# Patient Record
Sex: Male | Born: 1990 | Race: White | Hispanic: No | Marital: Single | Smoking: Current some day smoker
Health system: Southern US, Community
[De-identification: ages and names within clinical notes are randomized; demographics above are authoritative.]

## PROBLEM LIST (undated history)

## (undated) DIAGNOSIS — K219 Gastro-esophageal reflux disease without esophagitis: Secondary | ICD-10-CM

## (undated) DIAGNOSIS — L708 Other acne: Secondary | ICD-10-CM

## (undated) DIAGNOSIS — F988 Other specified behavioral and emotional disorders with onset usually occurring in childhood and adolescence: Secondary | ICD-10-CM

## (undated) HISTORY — DX: Other acne: L70.8

## (undated) HISTORY — PX: WISDOM TOOTH EXTRACTION: SHX21

## (undated) HISTORY — DX: Other specified behavioral and emotional disorders with onset usually occurring in childhood and adolescence: F98.8

## (undated) HISTORY — DX: Gastro-esophageal reflux disease without esophagitis: K21.9

---
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
## 2009-02-17 ENCOUNTER — Ambulatory Visit: Payer: Self-pay | Admitting: Family Medicine

## 2009-02-17 DIAGNOSIS — F988 Other specified behavioral and emotional disorders with onset usually occurring in childhood and adolescence: Secondary | ICD-10-CM | POA: Insufficient documentation

## 2009-02-17 DIAGNOSIS — L708 Other acne: Secondary | ICD-10-CM | POA: Insufficient documentation

## 2009-02-17 HISTORY — DX: Other acne: L70.8

## 2009-02-17 HISTORY — DX: Other specified behavioral and emotional disorders with onset usually occurring in childhood and adolescence: F98.8

## 2009-02-18 ENCOUNTER — Telehealth: Payer: Self-pay | Admitting: Family Medicine

## 2009-03-23 ENCOUNTER — Telehealth: Payer: Self-pay | Admitting: Family Medicine

## 2009-06-20 ENCOUNTER — Telehealth: Payer: Self-pay | Admitting: Family Medicine

## 2009-06-22 ENCOUNTER — Ambulatory Visit: Payer: Self-pay | Admitting: Family Medicine

## 2009-06-22 DIAGNOSIS — K219 Gastro-esophageal reflux disease without esophagitis: Secondary | ICD-10-CM

## 2009-06-22 HISTORY — DX: Gastro-esophageal reflux disease without esophagitis: K21.9

## 2009-07-04 ENCOUNTER — Telehealth: Payer: Self-pay | Admitting: Family Medicine

## 2009-08-16 ENCOUNTER — Telehealth: Payer: Self-pay | Admitting: Family Medicine

## 2011-07-24 LAB — BASIC METABOLIC PANEL
CO2: 28
Calcium: 9.7
Creatinine, Ser: 0.89
GFR calc Af Amer: >60
Sodium: 140

## 2011-08-27 ENCOUNTER — Encounter: Payer: Self-pay | Admitting: Family Medicine

## 2011-08-29 ENCOUNTER — Encounter: Payer: Self-pay | Admitting: Family Medicine

## 2011-08-29 ENCOUNTER — Ambulatory Visit (INDEPENDENT_AMBULATORY_CARE_PROVIDER_SITE_OTHER): Payer: PRIVATE HEALTH INSURANCE | Admitting: Family Medicine

## 2011-08-29 VITALS — BP 110/80 | Temp 98.2°F | Wt 178.0 lb

## 2011-08-29 DIAGNOSIS — N62 Hypertrophy of breast: Secondary | ICD-10-CM

## 2011-08-29 NOTE — Progress Notes (Signed)
  Subjective:    Patient ID: Maurice Roth, male    DOB: 30-Jan-1991, 20 y.o.   MRN: [id number]  HPI  Patient seen with asymmetric gynecomastia left breast greater than right. Noted for several months if not longer. Occasional minimal pain. Denies any nipple discharge. No headaches. No symptoms of hypogonadism. Prior history of marijuana use but not recently. Possible previous use of anabolic steroid. Currently not taking any supplements or prescription medications.   Review of Systems  Constitutional: Negative for appetite change, fatigue and unexpected weight change.  Cardiovascular: Negative for chest pain.  Neurological: Negative for dizziness and headaches.  Hematological: Negative for adenopathy. Does not bruise/bleed easily.       Objective:   Physical Exam  Constitutional: He appears well-developed and well-nourished.  Neck: Neck supple.  Cardiovascular: Normal rate, regular rhythm and normal heart sounds.   No murmur heard. Pulmonary/Chest: Effort normal. No respiratory distress. He has no wheezes. He has no rales.       Patient has some gynecomastia mostly involving the left breast. No focal masses. Mild gynecomastia involving the right breast. No nipple discharge. No adenopathy noted. No overlying skin changes.  Lymphadenopathy:    He has no cervical adenopathy.          Assessment & Plan:  Gynecomastia left breast greater than right. Possible triggering factors including previous use of marijuana and possible anabolic steroids. Gynecomastia education sheet given. Discussed avoidance of agents above. Followup promptly for any nipple discharge or any new symptoms. Explained this may take quite some time to go down.

## 2011-08-29 NOTE — Patient Instructions (Signed)
Gynecomastia, Pediatric Gynecomastia is swelling of the breast tissue in male infants and boys. It is caused by an imbalance of the hormones estrogen and testosterone. Boys going through puberty can develop temporary gynecomastia from normal changes in hormone levels. Much less often, gynecomastia is caused by one of many possible health problems. Gynecomastia is not a serious problem unless it is a sign of an underlying health condition. Boys with gynecomastia sometimes have pain or tenderness in their breasts. They may feel embarrassed or ashamed of their bodies. In most cases, this condition will go away on its own. If it is caused by medications or illicit drugs, it usually goes away after they are stopped. Occasionally, this condition may need treatment with medicines that help balance hormone levels. In a few cases, surgery to remove breast tissue is an option. SYMPTOMS  Signs and symptoms of may include:  Swollen breast gland tissue.   Breast tenderness.   Nipple discharge.   Swollen nipples (especially in adolescent boys).  There are few physical complications associated with temporary gynecomastia. This condition can cause psychological or emotional trouble caused by appearance. Although rare, gynecomastia slightly increases a risk for breast cancer in males. CAUSES  In most cases, gynecomastia is triggered by an imbalance in the hormones testosterone and estrogen. Several things can upset this hormone balance, including:  Natural hormone changes.   Medications.   Certain health conditions.  In about  of cases, the cause of gynecomastia is never found.  Hormone balance The hormones testosterone and estrogen control the development and maintenance of sex characteristics in both men and women. Testosterone controls male traits such as muscle mass and body hair. Estrogen controls male traits including the growth of breasts.  Most people think of estrogen as a male hormone. Males  also produce estrogen though normally in small amounts. In males, it helps regulate:  Bone density.   Sperm production.   Mood.  It may also have an effect on cardiovascular health. But male estrogen levels that are too high, or are out of balance with testosterone levels, can cause gynecomastia.  In infants Over half of male infants are born with enlarged breasts due to the effects of estrogen from their mothers. The swollen breast tissue usually goes away within 2-3 weeks after birth.  During puberty Gynecomastia caused by hormone changes during puberty is common. It affects over half of teenage boys. It is especially common in boys who are very tall or overweight. In most cases, the swollen breast tissue will go away without treatment within a few months. In a few cases, the swollen tissue will take up to two or three years to go away.  Medications A number of medications can cause gynecomastia. Of the following medicines, only antibiotics are commonly used in children. These include:   Medicines that block the effects of natural hormones called androgens. These medicines may be used to treat certain cancers. Examples of these medicines include:   Cyproterone.   Flutamide.   Finasteride.   AIDS medications. Gynecomastia can develop in HIV-positive men on a treatment regimen called highly active antiretroviral therapy (HAART). It is especially common in men who are taking efavirenz or didanosine.   Anti-anxiety medications such as diazepam (Valium).   Tricyclic antidepressants.   Antibiotics.   Ulcer medication.   Cancer treatment (chemotherapy).   Heart medications such as digitalis and calcium channel blockers.  Street drugs and alcohol Substances that can cause gynecomastia include:   Anabolic steroids and androgens gynecomastia  occurs in as many as half of athletes who use these substances.   Alcohol.   Amphetamines.   Marijuana.   Heroin.  Health  conditions Several health conditions can cause gynecomastia. These include:   Hypogonadism. This is a term indicating male genital size that is much smaller than normal. Conditions that cause hypogonadism interfere with normal testosterone production. These conditions (such as Klinefelter's syndrome or pituitary insufficiency) can also be associated with gynecomastia.   Tumors. Some tumors in children alter the male-male hormone balance. These tumors usually involve the:   Testes.   Adrenal glands.   Pituitary.   Lung.   Liver.   Hyperthyroidism. In this condition, the thyroid gland produces too much of the hormone thyroxine. This can lead to alterations in testosterone and estrogen that cause gynecomastia.   Kidney failure.   Liver failure and cirrhosis.   HIV. The human immunodeficiency virus that causes AIDS can cause gynecomastia. As noted above, some medicines used in the treatment of HIV also can cause gynecomastia.   Chest wall injury.   Spinal cord injury.   Starvation.  DIAGNOSIS   Your child's caregiver will:   Gather a medical history.   Consider the list of medicines your child is taking.   Gather a family history of health problems.   Perform an examination that includes the breast tissue, abdomen and genitals.   Your child's caregiver will want to be sure that breast swelling is actually gynecomastia and not a different condition. Other conditions that can cause similar symptoms include:   Fatty breast tissue. Some boys have chest fat that resembles gynecomastia. This is called pseudogynecomastia or false gynecomastia. It is not the same as gynecomastia.   Breast cancer. This is rare in boys. Enlargement of one breast or the presence of a discrete firm nodule raises the concern for male breast cancer.   A breast infection or abscess (mastitis).   Initial tests to determine the cause of your child's gynecomastia may include:   Blood tests.    Mammograms.   Further testing may be needed depending on initial test results, including:   Chest X-rays.   Computerized tomography (CT) scans.   Magnetic resonance imaging (MRI) scans.   Testicular ultrasounds.   Tissue biopsies.  TREATMENT   Most cases of gynecomastia get better over time without treatment. In a few cases, this condition is caused by an underlying condition which needs treatment. Most frequently, the underlying cause is hypogonadism.   If medicines are being taken that can cause gynecomastia, your caregiver may recommend stopping them or changing medications.   In adolescents with no apparent cause of gynecomastia, the doctor may recommend a re-evaluation every 6 months to see if the condition improves on its own. In 90 percent of teenage boys, gynecomastia goes away without treatment in less than three years.   Medications   In rare cases, medicines used to treat breast cancer and other conditions may be helpful for some boys with gynecomastia.   Surgery to remove excess breast tissue.   Surgical treatment may be considered if gynecomastia does not improve on its own, or if it causes significant pain, tenderness or embarrassment. Two types of surgery are available to treat this condition:   Liposuction - This surgery removes breast fat, but not the breast gland tissue itself.   Mastectomy -. This type of surgery removes the breast gland tissue. Only small incisions are used. The technique used is less invasive and involves less recovery time.  SEEK MEDICAL CARE IF:   There is swelling, pain, tenderness or nipple discharge in one or both breasts.   Medicines are being taken that are known to cause gynecomastia. Ask your child's caregiver about other choices.   There has been no improvement in 5-6 months.  SEEK IMMEDIATE MEDICAL CARE IF:   Red streaking develops on the skin around a nipple and/or breast that is already red, tender, or swollen.   Fever  of 102 F (38.9 C) develops.   Skin lumps develop in the area around the breast and/or underarm.   Skin breakdown or ulcers develop.  Document Released: 08/05/2007 Document Revised: 06/20/2011 Document Reviewed: 08/05/2007 [facility] Patient Information 2012 [place], [place].

## 2011-11-08 ENCOUNTER — Telehealth: Payer: Self-pay | Admitting: Family Medicine

## 2011-11-08 NOTE — Telephone Encounter (Signed)
He had asymmetric gynecomastia. This is not generally a surgical problem. We could get surgical consult but I doubt they would recommend any surgery. What we were palpating is breast tissue

## 2011-11-08 NOTE — Telephone Encounter (Signed)
Pts mom called and said that her son was in to see Dr Caryl Never about 1 month ago re: lump in chest. The pt is wondering if the lump could be surgically removed and if so, could Dr Caryl Never refer him to specialist? Pls call.

## 2011-11-08 NOTE — Telephone Encounter (Signed)
Please advise 

## 2011-11-08 NOTE — Telephone Encounter (Signed)
VM left on phone number left for mother, detailed message, if a surgical consult is requested, she is to call us back.

## 2012-01-07 ENCOUNTER — Telehealth: Payer: Self-pay | Admitting: Family Medicine

## 2012-01-07 DIAGNOSIS — N62 Hypertrophy of breast: Secondary | ICD-10-CM

## 2012-01-07 NOTE — Telephone Encounter (Signed)
I called mother back, she states son has requested moving forward with surgical consult, explained process.

## 2012-01-07 NOTE — Telephone Encounter (Signed)
Mom is calling back regarding con's gynecomastia. They DO want a surgical consults. Please call the mother. Thanks!

## 2012-01-08 NOTE — Telephone Encounter (Signed)
Will proceed with surgical consult.  I explained that this might be considered "cosmetic" surgery so not sure if insurance will cover.  Will refer to plastic surgeon.  Let family know we are working on consult.

## 2012-01-09 NOTE — Telephone Encounter (Signed)
Mother informed, requesting appt be made after 2:30pm please Camelia Eng, FYI

## 2012-06-09 ENCOUNTER — Encounter (INDEPENDENT_AMBULATORY_CARE_PROVIDER_SITE_OTHER): Payer: Self-pay | Admitting: General Surgery

## 2012-06-09 ENCOUNTER — Ambulatory Visit (INDEPENDENT_AMBULATORY_CARE_PROVIDER_SITE_OTHER): Payer: 59 | Admitting: General Surgery

## 2012-06-09 ENCOUNTER — Other Ambulatory Visit (INDEPENDENT_AMBULATORY_CARE_PROVIDER_SITE_OTHER): Payer: Self-pay | Admitting: General Surgery

## 2012-06-09 VITALS — BP 124/64 | HR 68 | Temp 97.1°F | Resp 16 | Ht 72.0 in | Wt 174.8 lb

## 2012-06-09 DIAGNOSIS — N62 Hypertrophy of breast: Secondary | ICD-10-CM

## 2012-06-09 NOTE — Progress Notes (Signed)
Faxed referral request and progress notes from today's visit to Dr. Maxcine Ham office fax # [phone].  Called Dr. Lucie Leather office. Faxed progress notes, highlighted notation stating patient needs to be referred to endocrinology. Fax # [phone].

## 2012-06-09 NOTE — Patient Instructions (Addendum)
Your diagnosis is bilateral gynecomastia, left greater than right.  The cause for this is not completely clear, although it is likely that it may have been due to the workout supplements you took 2 years ago.  We will contact Dr. Evelena Peat and arrange for a referral to an endocrinologist to make sure that there is no hormonal imbalance causing this.  You will be referred to a plastic surgeon to discuss options for reduction mammoplasty.  Return to see Dr. Derrell Lolling if any further problems arise.

## 2012-06-09 NOTE — Progress Notes (Signed)
Patient ID: Maurice Roth, male   DOB: January 18, 1991, 21 y.o.   MRN: [id number]  Chief Complaint  Patient presents with  . Other    eval chest wall lipoma/mass    HPI Maurice Roth is a 21 y.o. male.  He is referred by Dr. Evelena Peat for evaluation of macromastia.  The patient is a young healthy gentleman. 2 years ago he took a workout supplemental called 5-decasol.   He had a friend and also took this. Both of them got breast swelling about 6 months later. No significant pain. Mostly he is concerned about diagnosis and cosmetic problems. He states that he is interested in having these areas reduced. He has  ADD but is no longer on any meds. HPI  Past Medical History  Diagnosis Date  . 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 ADD 02/17/2009  . GERD 06/22/2009    History reviewed. No pertinent past surgical history.  Family History  Problem Relation Age of Onset  . Hypertension Father   . Arthritis Neg Hx     family hx  . Hyperlipidemia Neg Hx     family hx    Social History History  Substance Use Topics  . Smoking status: Passive Smoker  . Smokeless tobacco: Never Used  . Alcohol Use: Yes     occ    No Known Allergies  Current Outpatient Prescriptions  Medication Sig Dispense Refill  . Multiple Vitamins-Minerals (MENS MULTI VITAMIN & MINERAL PO) Take by mouth.          Review of Systems Review of Systems  Constitutional: Negative for fever, chills and unexpected weight change.  HENT: Negative for hearing loss, congestion, sore throat, trouble swallowing and voice change.   Eyes: Negative for visual disturbance.  Respiratory: Negative for cough and wheezing.   Cardiovascular: Negative for chest pain, palpitations and leg swelling.  Gastrointestinal: Negative for nausea, vomiting, abdominal pain, diarrhea, constipation, blood in stool, abdominal distention, anal bleeding and rectal pain.  Genitourinary: Negative for hematuria and difficulty urinating.  Musculoskeletal:  Negative for arthralgias.  Skin: Negative for rash and wound.  Neurological: Negative for seizures, syncope, weakness and headaches.  Hematological: Negative for adenopathy. Does not bruise/bleed easily.  Psychiatric/Behavioral: Negative for confusion.    Blood pressure 124/64, pulse 68, temperature 97.1 F (36.2 C), temperature source Temporal, resp. rate 16, height 6' (1.829 m), weight 174 lb 12.8 oz (79.289 kg).  Physical Exam Physical Exam  Constitutional: He is oriented to person, place, and time. He appears well-developed and well-nourished. No distress.  HENT:  Head: Normocephalic and atraumatic.  Neck: Normal range of motion. Neck supple. No JVD present. No tracheal deviation present. No thyromegaly present.  Cardiovascular: Normal rate, regular rhythm, normal heart sounds and intact distal pulses.   No murmur heard. Pulmonary/Chest: Effort normal and breath sounds normal. No stridor. No respiratory distress. He has no wheezes. He has no rales. He exhibits no tenderness.       Bilateral gynecomastia, left greater than right. Left side is fairly significant. Nontender. Both sides had concentric hypertrophy of breast tissue. No skin change. No plan areola looked normal. No axillary adenopathy. No mass.  Genitourinary: Penis normal.       Penis scrotum and testes are normal. No testicular mass. No testicular hypertrophy. No scrotal mass.  Musculoskeletal: Normal range of motion. He exhibits no edema and no tenderness.  Lymphadenopathy:    He has no cervical adenopathy.  Neurological: He is alert and oriented to person, place, and time.  He has normal reflexes. Coordination normal.  Skin: Skin is warm and dry. No rash noted. He is not diaphoretic. No erythema. No pallor.  Psychiatric: He has a normal mood and affect. His behavior is normal. Judgment and thought content normal.    Data Reviewed Dr. Lucie Leather office notes from 08/29/2011.  Assessment    Bilateral gynecomastia,  left greater than right. Etiology unclear, but possibly related to workout supplement he took 2 years ago.  No evidence of breast mass or malignancy.  Cosmetic surgery will be best done by a plastic surgeon, to avoid concavity and surgical defect.    Plan    We had a long discussion about diagnoses, differential diagnosis of etiology, and management of cosmetic defect.  He seems to understand these issues. He is interested both in Col[place] as well as cosmetic treatment Will be referred to one of the endocrinologist that is in Dr. Smitty Cords Burchette's group. We'll contact Dr. Caryl Never to arrange this referral.I suspect low probability of intrinsic endocrine disease.  He'll be referred to plastic surgery to discuss options for reduction mammoplasty.  Return to see me when necessary.       Angelia Mould. Derrell Lolling, M.D., [facility] General and Minimally invasive Surgery Breast and Colorectal Surgery Office:   [phone] Pager:   [phone]  06/09/2012, 10:06 AM

## 2012-06-20 ENCOUNTER — Telehealth: Payer: Self-pay | Admitting: Family Medicine

## 2012-06-20 NOTE — Telephone Encounter (Signed)
Pls advise.  

## 2012-06-20 NOTE — Telephone Encounter (Signed)
Pt was referred to plastic surgery for a cyst on his neck but could not go to appt because insurance changed. Pt mother called requesting a new referral to [facility] [address] [place], [place] [address]

## 2012-06-23 NOTE — Telephone Encounter (Signed)
OK to refer.

## 2012-06-24 ENCOUNTER — Other Ambulatory Visit: Payer: Self-pay | Admitting: Family Medicine

## 2012-06-24 DIAGNOSIS — D229 Melanocytic nevi, unspecified: Secondary | ICD-10-CM

## 2012-06-24 NOTE — Telephone Encounter (Signed)
Referral corded.  

## 2012-07-10 ENCOUNTER — Encounter (INDEPENDENT_AMBULATORY_CARE_PROVIDER_SITE_OTHER): Payer: 59 | Admitting: General Surgery

## 2013-09-03 ENCOUNTER — Encounter: Payer: Self-pay | Admitting: Emergency Medicine

## 2013-09-03 ENCOUNTER — Ambulatory Visit: Payer: PRIVATE HEALTH INSURANCE | Admitting: Emergency Medicine

## 2013-09-03 VITALS — BP 118/80 | HR 82 | Temp 98.6°F | Resp 18 | Wt 171.0 lb

## 2013-09-03 DIAGNOSIS — R509 Fever, unspecified: Secondary | ICD-10-CM

## 2013-09-03 DIAGNOSIS — R05 Cough: Secondary | ICD-10-CM

## 2013-09-03 DIAGNOSIS — J029 Acute pharyngitis, unspecified: Secondary | ICD-10-CM

## 2013-09-03 MED ORDER — AZITHROMYCIN 250 MG PO TABS: ORAL_TABLET | ORAL | Status: AC

## 2013-09-03 MED ORDER — PREDNISONE 10 MG PO TABS: ORAL_TABLET | ORAL | Status: DC

## 2013-09-03 MED ORDER — BENZONATATE 100 MG PO CAPS: 100.0000 mg | ORAL_CAPSULE | Freq: Three times a day (TID) | ORAL | Status: DC | PRN

## 2013-09-03 NOTE — Patient Instructions (Signed)
Strep Throat  Strep throat is an infection of the throat caused by a bacteria named Streptococcus pyogenes. Your caregiver may call the infection streptococcal "tonsillitis" or "pharyngitis" depending on whether there are signs of inflammation in the tonsils or back of the throat. Strep throat is most common in children aged 23 15 years during the cold months of the year, but it can occur in people of any age during any season. This infection is spread from person to person (contagious) through coughing, sneezing, or other close contact.  SYMPTOMS   · Fever or chills.  · Painful, swollen, red tonsils or throat.  · Pain or difficulty when swallowing.  · White or yellow spots on the tonsils or throat.  · Swollen, tender lymph nodes or "glands" of the neck or under the jaw.  · Red rash all over the body (rare).  DIAGNOSIS   Many different infections can cause the same symptoms. A test must be done to confirm the diagnosis so the right treatment can be given. A "rapid strep test" can help your caregiver make the diagnosis in a few minutes. If this test is not available, a light swab of the infected area can be used for a throat culture test. If a throat culture test is done, results are usually available in a day or two.  TREATMENT   Strep throat is treated with antibiotic medicine.  HOME CARE INSTRUCTIONS   · Gargle with 1 tsp of salt in 1 cup of warm water, 3 4 times per day or as needed for comfort.  · Family members who also have a sore throat or fever should be tested for strep throat and treated with antibiotics if they have the strep infection.  · Make sure everyone in your household washes their hands well.  · Do not share food, drinking cups, or personal items that could cause the infection to spread to others.  · You may need to eat a soft food diet until your sore throat gets better.  · Drink enough water and fluids to keep your urine clear or pale yellow. This will help prevent dehydration.  · Get plenty of  rest.  · Stay home from school, daycare, or work until you have been on antibiotics for 24 hours.  · Only take over-the-counter or prescription medicines for pain, discomfort, or fever as directed by your caregiver.  · If antibiotics are prescribed, take them as directed. Finish them even if you start to feel better.  SEEK MEDICAL CARE IF:   · The glands in your neck continue to enlarge.  · You develop a rash, cough, or earache.  · You cough up green, yellow-brown, or bloody sputum.  · You have pain or discomfort not controlled by medicines.  · Your problems seem to be getting worse rather than better.  SEEK IMMEDIATE MEDICAL CARE IF:   · You develop any new symptoms such as vomiting, severe headache, stiff or painful neck, chest pain, shortness of breath, or trouble swallowing.  · You develop severe throat pain, drooling, or changes in your voice.  · You develop swelling of the neck, or the skin on the neck becomes red and tender.  · You have a fever.  · You develop signs of dehydration, such as fatigue, dry mouth, and decreased urination.  · You become increasingly sleepy, or you cannot wake up completely.  Document Released: 10/05/2000 Document Revised: 09/24/2012 Document Reviewed: 12/07/2010  [employer]® Patient Information ©2014 [employer].

## 2013-09-04 NOTE — Progress Notes (Signed)
  Subjective:    Patient ID: Maurice Roth, male    DOB: 1990/12/01, 22 y.o.   MRN: [id number]  HPI Comments: 22 yo male presents with HX of fever of 102 at home x 1 day and increasing ST and headache. No relief with OTC Advil or rest.  Sore Throat  Associated symptoms include coughing and headaches.  Headache  Associated symptoms include coughing, a fever and a sore throat.  Cough Associated symptoms include a fever, headaches and a sore throat.      Review of Systems  Constitutional: Positive for fever and fatigue.  HENT: Positive for sore throat.   Respiratory: Positive for cough.   Neurological: Positive for headaches.  All other systems reviewed and are negative.       Objective:   Physical Exam  Nursing note and vitals reviewed. Constitutional: He is oriented to person, place, and time. He appears well-developed and well-nourished.  HENT:  Head: Normocephalic and atraumatic.  Right Ear: External ear normal.  Left Ear: External ear normal.  Nose: Nose normal.  Mild erythema posterior pharynx  Eyes: Pupils are equal, round, and reactive to light.  Neck: Normal range of motion.  Cardiovascular: Normal rate, regular rhythm, normal heart sounds and intact distal pulses.   Pulmonary/Chest: Effort normal and breath sounds normal.  Dry tight cough  Abdominal: Soft. Bowel sounds are normal. There is no tenderness.  Musculoskeletal: Normal range of motion.  Lymphadenopathy:    He has no cervical adenopathy.  Neurological: He is alert and oriented to person, place, and time.  Skin: Skin is warm and dry.  Psychiatric: He has a normal mood and affect. Judgment normal.       + Rapid Strept  - FLu    Assessment & Plan:  Strept with + rapid strept Test in office. Zpak and Pred 10 mg DP AD. OOW x 3days. Rest push fluids, Tylenol OTC AD, ER if symptoms increase.

## 2013-10-07 ENCOUNTER — Ambulatory Visit: Payer: Self-pay | Admitting: Physician Assistant

## 2014-04-14 ENCOUNTER — Encounter: Payer: Self-pay | Admitting: Emergency Medicine

## 2014-04-14 ENCOUNTER — Ambulatory Visit (INDEPENDENT_AMBULATORY_CARE_PROVIDER_SITE_OTHER): Payer: PRIVATE HEALTH INSURANCE | Admitting: Emergency Medicine

## 2014-04-14 VITALS — BP 134/82 | HR 86 | Temp 98.2°F | Resp 18 | Wt 184.0 lb

## 2014-04-14 DIAGNOSIS — L255 Unspecified contact dermatitis due to plants, except food: Secondary | ICD-10-CM

## 2014-04-14 DIAGNOSIS — R030 Elevated blood-pressure reading, without diagnosis of hypertension: Secondary | ICD-10-CM

## 2014-04-14 MED ORDER — DEXAMETHASONE SODIUM PHOSPHATE 100 MG/10ML IJ SOLN
10.0000 mg | Freq: Once | INTRAMUSCULAR | Status: AC
  Administered 2014-04-14: 10 mg via INTRAMUSCULAR

## 2014-04-14 NOTE — Progress Notes (Signed)
   Subjective:    Patient ID: Maurice Roth, male    DOB: [birth year], 23 y.o.   MRN: [id number]  HPI Comments: 23 yo WM notes + exposure to poison IVY on Sunday but notes rash did not start until today. He notes redness/ swelling/ itch on face, chest and arms. He has not taken any OTC to help with symptoms. He notes he usually needs shot to improve rash.  Poison Ivy      Review of Systems  Skin: Positive for rash.  All other systems reviewed and are negative.  BP 134/82  Pulse 86  Temp(Src) 98.2 F (36.8 C) (Temporal)  Resp 18  Wt 184 lb (83.462 kg)     Objective:   Physical Exam  Nursing note and vitals reviewed. Constitutional: He is oriented to person, place, and time. He appears well-developed and well-nourished.  HENT:  Head: Normocephalic and atraumatic.  Right Ear: External ear normal.  Left Ear: External ear normal.  Nose: Nose normal.  Mouth/Throat: Oropharynx is clear and moist.  Eyes: Conjunctivae are normal.  Cardiovascular: Normal rate and normal heart sounds.   Pulmonary/Chest: Effort normal and breath sounds normal.  Musculoskeletal: Normal range of motion.  Lymphadenopathy:    He has no cervical adenopathy.  Neurological: He is alert and oriented to person, place, and time.  Skin: Rash noted. There is erythema.     Psychiatric: He has a normal mood and affect. Judgment normal.          Assessment & Plan:  1. Rhus dermatitis- Dexamethasone 10 mg IM, Zyrtec 10 mg OTC, Calamine lotion otc, declines rx steroid cream. Hygiene eplained, w/c if SX increase or ER.   2. Elevated BP w/o HTN- Check BP call if >130/80, increase cardio

## 2014-04-14 NOTE — Patient Instructions (Signed)
Contact Dermatitis °Contact dermatitis is a rash that happens when something touches the skin. You touched something that irritates your skin, or you have allergies to something you touched. °HOME CARE  °· Avoid the thing that caused your rash. °· Keep your rash away from hot water, soap, sunlight, chemicals, and other things that might bother it. °· Do not scratch your rash. °· You can take cool baths to help stop itching. °· Only take medicine as told by your doctor. °· Keep all doctor visits as told. °GET HELP RIGHT AWAY IF:  °· Your rash is not better after 3 days. °· Your rash gets worse. °· Your rash is puffy (swollen), tender, red, sore, or warm. °· You have problems with your medicine. °MAKE SURE YOU:  °· Understand these instructions. °· Will watch your condition. °· Will get help right away if you are not doing well or get worse. °Document Released: 08/05/2009 Document Revised: 12/31/2011 Document Reviewed: 03/13/2011 °[employer]® Patient Information ©2015 [employer]. This information is not intended to replace advice given to you by your health care provider. Make sure you discuss any questions you have with your health care provider. ° °

## 2014-06-08 ENCOUNTER — Encounter: Payer: Self-pay | Admitting: Physician Assistant

## 2014-06-08 ENCOUNTER — Ambulatory Visit (INDEPENDENT_AMBULATORY_CARE_PROVIDER_SITE_OTHER): Payer: PRIVATE HEALTH INSURANCE | Admitting: Physician Assistant

## 2014-06-08 VITALS — BP 110/60 | HR 60 | Temp 98.6°F | Resp 16 | Ht 72.0 in | Wt 184.0 lb

## 2014-06-08 DIAGNOSIS — B880 Other acariasis: Secondary | ICD-10-CM

## 2014-06-08 MED ORDER — DEXAMETHASONE SODIUM PHOSPHATE 10 MG/ML IJ SOLN
10.0000 mg | Freq: Once | INTRAMUSCULAR | Status: AC
  Administered 2014-06-08: 10 mg via INTRAMUSCULAR

## 2014-06-08 MED ORDER — CLOBETASOL PROPIONATE 0.05 % EX CREA: 1.0000 "application " | TOPICAL_CREAM | Freq: Two times a day (BID) | CUTANEOUS | Status: DC

## 2014-06-08 MED ORDER — PREDNISONE 20 MG PO TABS: ORAL_TABLET | ORAL | Status: DC

## 2014-06-08 NOTE — Patient Instructions (Signed)
Warm water and soap and scrub the areas  Insect Bite Mosquitoes, flies, fleas, bedbugs, and many other insects can bite. Insect bites are different from insect stings. A sting is when venom is injected into the skin. Some insect bites can transmit infectious diseases. SYMPTOMS  Insect bites usually turn red, swell, and itch for 2 to 4 days. They often go away on their own. TREATMENT  Your caregiver may prescribe antibiotic medicines if a bacterial infection develops in the bite. HOME CARE INSTRUCTIONS  Do not scratch the bite area.  Keep the bite area clean and dry. Wash the bite area thoroughly with soap and water.  Put ice or cool compresses on the bite area.  Put ice in a plastic bag.  Place a towel between your skin and the bag.  Leave the ice on for 20 minutes, 4 times a day for the first 2 to 3 days, or as directed.  You may apply a baking soda paste, cortisone cream, or calamine lotion to the bite area as directed by your caregiver. This can help reduce itching and swelling.  Only take over-the-counter or prescription medicines as directed by your caregiver.  If you are given antibiotics, take them as directed. Finish them even if you start to feel better. You may need a tetanus shot if:  You cannot remember when you had your last tetanus shot.  You have never had a tetanus shot.  The injury broke your skin. If you get a tetanus shot, your arm may swell, get red, and feel warm to the touch. This is common and not a problem. If you need a tetanus shot and you choose not to have one, there is a rare chance of getting tetanus. Sickness from tetanus can be serious. SEEK IMMEDIATE MEDICAL CARE IF:   You have increased pain, redness, or swelling in the bite area.  You see a red line on the skin coming from the bite.  You have a fever.  You have joint pain.  You have a headache or neck pain.  You have unusual weakness.  You have a rash.  You have chest pain or  shortness of breath.  You have abdominal pain, nausea, or vomiting.  You feel unusually tired or sleepy. MAKE SURE YOU:   Understand these instructions.  Will watch your condition.  Will get help right away if you are not doing well or get worse. Document Released: 11/15/2004 Document Revised: 12/31/2011 Document Reviewed: 05/09/2011 [facility] Patient Information 2015 [place], [place]. This information is not intended to replace advice given to you by your health care provider. Make sure you discuss any questions you have with your health care provider.

## 2014-06-08 NOTE — Progress Notes (Signed)
   Subjective:    Patient ID: Maurice Roth, male    DOB: 1991/08/22, 23 y.o.   MRN: [id number]  HPI 23 y.o. male was helping his friend put on deer stands in the woods and started to get rash/bites along legs, groin, back, AB. His friend has the same thing. Very puritic, vesicles.    Review of Systems  Constitutional: Negative.   HENT: Negative.   Respiratory: Negative.   Cardiovascular: Negative.   Gastrointestinal: Negative.   Genitourinary: Negative.   Musculoskeletal: Negative.   Skin: Positive for rash.  Neurological: Negative.        Objective:   Physical Exam  Constitutional: He appears well-developed and well-nourished.  Neck: Normal range of motion. Neck supple.  Cardiovascular: Normal rate and regular rhythm.   Pulmonary/Chest: Effort normal and breath sounds normal.  Skin: Skin is warm and dry. Rash (clustered,sporadic erythematous vesicles worst on bilateral legs but goes to AB, back, and up legs. ) noted.       Assessment & Plan:  Chiggers- warm water/soap with scrubbing, topical steriod, oral steroid, can do benadryl

## 2014-07-14 ENCOUNTER — Ambulatory Visit (INDEPENDENT_AMBULATORY_CARE_PROVIDER_SITE_OTHER): Payer: PRIVATE HEALTH INSURANCE | Admitting: Internal Medicine

## 2014-07-14 ENCOUNTER — Encounter: Payer: Self-pay | Admitting: Internal Medicine

## 2014-07-14 DIAGNOSIS — R69 Illness, unspecified: Secondary | ICD-10-CM

## 2014-07-14 NOTE — Progress Notes (Signed)
Patient ID: Maurice Roth, male   DOB: 08/04/1991, 23 y.o.   MRN: [id number]    Madlyn Frankel

## 2014-10-26 ENCOUNTER — Encounter: Payer: Self-pay | Admitting: Physician Assistant

## 2014-10-26 ENCOUNTER — Ambulatory Visit (INDEPENDENT_AMBULATORY_CARE_PROVIDER_SITE_OTHER): Payer: 59 | Admitting: Physician Assistant

## 2014-10-26 VITALS — BP 122/70 | HR 72 | Temp 98.4°F | Resp 18 | Ht 72.0 in | Wt 185.0 lb

## 2014-10-26 DIAGNOSIS — L03116 Cellulitis of left lower limb: Secondary | ICD-10-CM

## 2014-10-26 MED ORDER — CEFTRIAXONE SODIUM 1 G IJ SOLR
1.0000 g | Freq: Once | INTRAMUSCULAR | Status: AC
  Administered 2014-10-26: 1 g via INTRAMUSCULAR

## 2014-10-26 MED ORDER — DOXYCYCLINE HYCLATE 100 MG PO TABS: 100.0000 mg | ORAL_TABLET | Freq: Two times a day (BID) | ORAL | Status: DC

## 2014-10-26 NOTE — Progress Notes (Signed)
Subjective:    Patient ID: Maurice Roth, male    DOB: August 12, 1991, 24 y.o.   MRN: [id number]  HPI  A Caucasian 24yo male presents to the office today due to possible spider bite that started about 4-5 days ago.  Patient states he noticed an increase in redness, swelling and pain on his left upper thigh.  He states he does go into the woods a lot.  He states there was no insect on him when he noticed the bite.  He states his pain level is 7 out of 10 right now.  He states the pain is worse with walking.  Patient states he did not take any over the counter medication.  He does state that his "veins were turning color".  He states the area was not painful in the beginning.    GFR= >60 on 09/02/2008 Review of Systems  Constitutional: Negative.   HENT: Negative.   Eyes: Negative.   Respiratory: Negative.   Cardiovascular: Negative.   Gastrointestinal: Negative.   Genitourinary: Negative.   Musculoskeletal: Negative.   Skin: Positive for color change. Negative for rash.       Skin is red and swollen on left upper thigh  Neurological: Negative.   Psychiatric/Behavioral: Negative.    Past Medical History  Diagnosis Date  . 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 ADD 02/17/2009  . GERD 06/22/2009   No current outpatient prescriptions on file prior to visit.   No current facility-administered medications on file prior to visit.   No Known Allergies    BP 122/70 mmHg  Pulse 72  Temp(Src) 98.4 F (36.9 C) (Temporal)  Resp 18  Ht 6' (1.829 m)  Wt 185 lb (83.915 kg)  BMI 25.08 kg/m2  SpO2 99% Wt Readings from Last 3 Encounters:  10/26/14 185 lb (83.915 kg)  06/08/14 184 lb (83.462 kg)  04/14/14 184 lb (83.462 kg)   Objective:   Physical Exam  Constitutional: He is oriented to person, place, and time. He appears well-developed and well-nourished. He does not have a sickly appearance. No distress.  HENT:  Head: Normocephalic.  Eyes: Conjunctivae, EOM and lids are normal. Pupils are equal,  round, and reactive to light. Right eye exhibits no discharge. Left eye exhibits no discharge. No scleral icterus.  Neck: Phonation normal.  Cardiovascular: Normal rate, regular rhythm, S1 normal, S2 normal, normal heart sounds, intact distal pulses and normal pulses.  Exam reveals no gallop, no distant heart sounds and no friction rub.   No murmur heard. Pulmonary/Chest: Effort normal and breath sounds normal. No stridor. No respiratory distress. He has no decreased breath sounds. He has no wheezes. He has no rhonchi. He has no rales. He exhibits no tenderness.  Abdominal: Soft. Bowel sounds are normal.  Lymphadenopathy:  No tenderness or LAD.  Neurological: He is alert and oriented to person, place, and time. He has normal strength. No cranial nerve deficit or sensory deficit. Gait normal.  Skin: Skin is warm, dry and intact. He is not diaphoretic. There is erythema. No pallor.     Psychiatric: He has a normal mood and affect. His speech is normal and behavior is normal. Judgment and thought content normal. Cognition and memory are normal.  Vitals reviewed.  Assessment & Plan:  1. Cellulitis of leg, left -Gave Rocephin shot in office and patient tolerated without complications- cefTRIAXone (ROCEPHIN) injection 1 g; Inject 1 g into the muscle once. -Take doxycycline as prescribed with food- doxycycline (VIBRA-TABS) 100 MG tablet; Take 1 tablet (100  mg total) by mouth 2 (two) times daily. For 14 days  Dispense: 28 tablet; Refill: 0 Take Ibuprofen or Tylenol for pain- follow directions on bottle  Spoke with Dr. Melford Aase and he came in to look at patient's leg and he agreed to Rocephin shot and Doxycycline.  Told patient to go to ED immediately if he notices more redness, swelling, pain, red streaks and if he develops fever, chills, sweats, fatigue, swollen bump in infected area, vomiting, diarrhea, muscle aches.  Discussed medication effects and SE's.  Pt agreed to treatment plan. Please  follow up on Friday for recheck.  Camauri Fleece, Stephani Police, PA-C 10:37 AM [facility]

## 2014-10-26 NOTE — Patient Instructions (Signed)
-   Please take Doxycycline with food.  Take Ibuprofen or Tylenol for pain.- follow directions on bottle and take with food.   If you are noticing the redness and swelling spreading and not getting better, then please go to the ED immediately.  If you develop a fever, chills, sweats, fatigue, chest pain, shortness of breath, abdominal pain, nausea, vomiting, diarrhea, constipation, then please go to ED immediately.  If you notice the redness, swelling and pain not getting better by Friday, then please come in for visit that day.  Cellulitis Cellulitis is an infection of the skin and the tissue beneath it. The infected area is usually red and tender. Cellulitis occurs most often in the arms and lower legs.  CAUSES  Cellulitis is caused by bacteria that enter the skin through cracks or cuts in the skin. The most common types of bacteria that cause cellulitis are staphylococci and streptococci. SIGNS AND SYMPTOMS   Redness and warmth.  Swelling.  Tenderness or pain.  Fever. DIAGNOSIS  Your health care provider can usually determine what is wrong based on a physical exam. Blood tests may also be done. TREATMENT  Treatment usually involves taking an antibiotic medicine. HOME CARE INSTRUCTIONS   Take your antibiotic medicine as directed by your health care provider. Finish the antibiotic even if you start to feel better.  Keep the infected arm or leg elevated to reduce swelling.  Apply a warm cloth to the affected area up to 4 times per day to relieve pain.  Take medicines only as directed by your health care provider.  Keep all follow-up visits as directed by your health care provider. SEEK MEDICAL CARE IF:   You notice red streaks coming from the infected area.  Your red area gets larger or turns dark in color.  Your bone or joint underneath the infected area becomes painful after the skin has healed.  Your infection returns in the same area or another area.  You notice a  swollen bump in the infected area.  You develop new symptoms.  You have a fever. SEEK IMMEDIATE MEDICAL CARE IF:   You feel very sleepy.  You develop vomiting or diarrhea.  You have a general ill feeling (malaise) with muscle aches and pains. MAKE SURE YOU:   Understand these instructions.  Will watch your condition.  Will get help right away if you are not doing well or get worse. Document Released: 07/18/2005 Document Revised: 02/22/2014 Document Reviewed: 12/24/2011 [facility] Patient Information 2015 [place], [place]. This information is not intended to replace advice given to you by your health care provider. Make sure you discuss any questions you have with your health care provider.

## 2014-10-29 ENCOUNTER — Encounter: Payer: Self-pay | Admitting: Physician Assistant

## 2014-10-29 ENCOUNTER — Ambulatory Visit (INDEPENDENT_AMBULATORY_CARE_PROVIDER_SITE_OTHER): Payer: 59 | Admitting: Physician Assistant

## 2014-10-29 VITALS — BP 118/68 | HR 70 | Temp 98.6°F | Resp 18 | Ht 72.0 in | Wt 188.0 lb

## 2014-10-29 DIAGNOSIS — L0390 Cellulitis, unspecified: Secondary | ICD-10-CM

## 2014-10-29 DIAGNOSIS — L0291 Cutaneous abscess, unspecified: Secondary | ICD-10-CM

## 2014-10-29 MED ORDER — HYDROCODONE-ACETAMINOPHEN 5-325 MG PO TABS: 1.0000 | ORAL_TABLET | Freq: Four times a day (QID) | ORAL | Status: DC | PRN

## 2014-10-29 NOTE — Progress Notes (Signed)
Subjective:    Patient ID: Maurice Roth, male    DOB: 1991-09-12, 24 y.o.   MRN: [id number]  HPI  A Caucasian 24yo male presents to the office today for follow up on cellulitis on left anterior thigh.  Patient was last seen 10/26/14 and was prescribed Doxycycline 100mg - 1 tablet BID.  Patient states he is taking antibiotic as prescribed.  He reports being in the bath the other day (does not remember which day) and noticed some "pus" drainage.  He decided to push on it more and more drainage came out.  Patient states the redness and swelling is better.  He has only taken 2 Tylenol tablets all together, but states he still has pain.  Patient denies alcohol use and reports that he hangs out with friends that do smoke marijuana.  He denies current use, but states he tried it once and caused him ED issues.  Spoke with patient about side effects of marijuana and encouraged him to not do drugs.    Last Visit: A Caucasian 24yo male presents to the office today due to possible spider bite that started about 4-5 days ago.  Patient states he noticed an increase in redness, swelling and pain on his left upper thigh.  He states he does go into the woods a lot.  He states there was no insect on him when he noticed the bite.  He states his pain level is 7 out of 10 right now.  He states the pain is worse with walking.  Patient states he did not take any over the counter medication.  He does state that his "veins were turning color".  He states the area was not painful in the beginning.    GFR= >60 on 09/02/2008 Review of Systems  Constitutional: Negative.   HENT: Negative.   Eyes: Negative.   Respiratory: Negative.   Cardiovascular: Negative.   Gastrointestinal: Negative.   Genitourinary: Negative.   Musculoskeletal: Negative.   Skin: Positive for color change. Negative for rash.       Skin is red and swollen on left upper thigh  Neurological: Negative.   Psychiatric/Behavioral: Negative.    Past Medical  History  Diagnosis Date  . 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 ADD 02/17/2009  . GERD 06/22/2009   Current Outpatient Prescriptions on File Prior to Visit  Medication Sig Dispense Refill  . doxycycline (VIBRA-TABS) 100 MG tablet Take 1 tablet (100 mg total) by mouth 2 (two) times daily. For 14 days 28 tablet 0   No current facility-administered medications on file prior to visit.   No Known Allergies    BP 118/68 mmHg  Pulse 70  Temp(Src) 98.6 F (37 C) (Temporal)  Resp 18  Ht 6' (1.829 m)  Wt 188 lb (85.276 kg)  BMI 25.49 kg/m2  SpO2 98% Wt Readings from Last 3 Encounters:  10/29/14 188 lb (85.276 kg)  10/26/14 185 lb (83.915 kg)  06/08/14 184 lb (83.462 kg)   Objective:   Physical Exam  Constitutional: He is oriented to person, place, and time. He appears well-developed and well-nourished. He does not have a sickly appearance. No distress.  HENT:  Head: Normocephalic.  Eyes: Conjunctivae, EOM and lids are normal. Pupils are equal, round, and reactive to light. Right eye exhibits no discharge. Left eye exhibits no discharge. No scleral icterus.  Neck: Phonation normal.  Cardiovascular: Normal rate, regular rhythm, S1 normal, S2 normal, normal heart sounds, intact distal pulses and normal pulses.  Exam reveals no gallop, no  distant heart sounds and no friction rub.   No murmur heard. Pulmonary/Chest: Effort normal and breath sounds normal. No stridor. No respiratory distress. He has no decreased breath sounds. He has no wheezes. He has no rhonchi. He has no rales. He exhibits no tenderness.  Abdominal: Soft. Bowel sounds are normal.  Lymphadenopathy:  No tenderness or LAD.  Neurological: He is alert and oriented to person, place, and time. He has normal strength. No cranial nerve deficit or sensory deficit. Gait normal.  Skin: Skin is warm, dry and intact. He is not diaphoretic. There is erythema. No pallor.     Psychiatric: He has a normal mood and affect. His speech is normal and  behavior is normal. Judgment and thought content normal. Cognition and memory are normal.  Vitals reviewed.  Incision and Drainage Procedure Note Pre-operative Diagnosis: Subcutaneous localized abscess of left anterior thigh that is fluctuant, erythematous, painful, swollen and not resolving spontaneously. Post-operative Diagnosis: normal Indications: Abscess (purluent discharge) and cellulitis Anesthesia: Marcaine 0.5% with Epinephrine  Procedure Details  The procedure, risks and complications (cellulitis, recollection of pus, bacteremia, septicemia) have been discussed in detail (including, but not limited to airway compromise, infection, bleeding, scarring) with the patient, and the patient gave verbal consent to the procedure.  The skin was sterilely prepped (alcohol) and draped with chucks around the affected area in the usual fashion. After adequate local anesthesia, I&D with a #11 blade was performed on the left anterior thigh.  Incision was 2cm long.  Purulent drainage: present The patient was observed until stable. Findings: Purulent material- liquid and solid EBL: 1-2 cc's Drains: None Dressing: Iodoform Packing was placed into incision site.  Then two 4x4 gauze pads were placed over incision site with Bacitracin placed on gauze.  Then Ace Wrap was placed around left thigh to keep bandage in place.  Condition: Tolerated procedure well and Stable Complications: none.   Assessment & Plan:  1. Abscess and Cellulitis of leg, left -Continue Doxycycline as prescribed -Take Norco as prescribed for pain with food- HYDROcodone-acetaminophen (NORCO) 5-325 MG per tablet; Take 1 tablet by mouth every 6 (six) hours as needed for moderate pain.  Dispense: 60 tablet; Refill: 0 -Explained wound care to patient  Spoke with Dr. Melford Aase and he came in to look at patient's leg and he agreed that I should do I&D to open pus pocket.  Told patient to go to ED immediately if he notices more redness,  swelling, pain, red streaks and if he develops fever, chills, sweats, fatigue, swollen bump in infected area, vomiting, diarrhea, muscle aches.  Discussed medication effects and SE's.  Pt agreed to treatment plan. Please follow up on Monday for recheck.  Virdie Penning, Stephani Police, PA-C 10:05 AM [facility]

## 2014-10-29 NOTE — Patient Instructions (Addendum)
-Please change bandage every day. -Please pull on iodoform about 2-65mm per day.  If you cannot, then leave iodoform in place.  No exercise until wound heals. -If the dressing is getting soaked with blood, then go ahead and change the outer dressing but keep the wick in place in the wound.  Continue antibiotic. Continue warm wet compresses and you can buy a sitz bath from [facility] and do 2-3 times a day  Call if any worsening discharge, fever, chills, warmth, redness.   Please schedule appt on Monday for recheck   Incision and Drainage Care After Refer to this sheet in the next few weeks. These instructions provide you with information on caring for yourself after your procedure. Your caregiver may also give you more specific instructions. Your treatment has been planned according to current medical practices, but problems sometimes occur. Call your caregiver if you have any problems or questions after your procedure. HOME CARE INSTRUCTIONS   If antibiotic medicine is given, take it as directed. Finish it even if you start to feel better.  Only take over-the-counter or prescription medicines for pain, discomfort, or fever as directed by your caregiver.  Keep all follow-up appointments as directed by your caregiver.  Change any bandages (dressings) as directed by your caregiver. Replace old dressings with clean dressings.  Wash your hands before and after caring for your wound. You will receive specific instructions for cleansing and caring for your wound.  SEEK MEDICAL CARE IF:   You have increased pain, swelling, or redness around the wound.  You have increased drainage, smell, or bleeding from the wound.  You have muscle aches, chills, or you feel generally sick.  You have a fever. MAKE SURE YOU:   Understand these instructions.  Will watch your condition.  Will get help right away if you are not doing well or get worse. Document Released: 12/31/2011 Document Reviewed:  12/31/2011 [facility] Patient Information 2015 [place]. This information is not intended to replace advice given to you by your health care provider. Make sure you discuss any questions you have with your health care provider.  Abscess An abscess is an infected area that contains a collection of pus and debris.It can occur in almost any part of the body. An abscess is also known as a furuncle or boil. CAUSES  An abscess occurs when tissue gets infected. This can occur from blockage of oil or sweat glands, infection of hair follicles, or a minor injury to the skin. As the body tries to fight the infection, pus collects in the area and creates pressure under the skin. This pressure causes pain. People with weakened immune systems have difficulty fighting infections and get certain abscesses more often.  SYMPTOMS Usually an abscess develops on the skin and becomes a painful mass that is red, warm, and tender. If the abscess forms under the skin, you may feel a moveable soft area under the skin. Some abscesses break open (rupture) on their own, but most will continue to get worse without care. The infection can spread deeper into the body and eventually into the bloodstream, causing you to feel ill.  TREATMENT  Your caregiver may prescribe antibiotic medicines to fight the infection. However, taking antibiotics alone usually does not cure an abscess. Your caregiver may need to make a small cut (incision) in the abscess to drain the pus. In some cases, gauze is packed into the abscess to reduce pain and to continue draining the area. HOME CARE INSTRUCTIONS  Only take over-the-counter or prescription medicines for pain, discomfort, or fever as directed by your caregiver.  If you were prescribed antibiotics, take them as directed. Finish them even if you start to feel better.  If gauze is used, follow your caregiver's directions for changing the gauze.  To avoid spreading the infection:  Keep  your draining abscess covered with a bandage.  Wash your hands well.  Do not share personal care items, towels, or whirlpools with others.  Avoid skin contact with others.  Keep your skin and clothes clean around the abscess.  Keep all follow-up appointments as directed by your caregiver. SEEK MEDICAL CARE IF:   You have increased pain, swelling, redness, fluid drainage, or bleeding.  You have muscle aches, chills, or a general ill feeling.  You have a fever. MAKE SURE YOU:   Understand these instructions.  Will watch your condition.  Will get help right away if you are not doing well or get worse.

## 2014-11-01 ENCOUNTER — Encounter: Payer: Self-pay | Admitting: Physician Assistant

## 2014-11-01 ENCOUNTER — Ambulatory Visit: Payer: 59 | Admitting: Physician Assistant

## 2014-11-01 VITALS — BP 124/70 | HR 60 | Temp 98.6°F | Resp 18 | Ht 72.0 in

## 2014-11-01 DIAGNOSIS — L03119 Cellulitis of unspecified part of limb: Principal | ICD-10-CM

## 2014-11-01 DIAGNOSIS — B350 Tinea barbae and tinea capitis: Secondary | ICD-10-CM

## 2014-11-01 DIAGNOSIS — L02419 Cutaneous abscess of limb, unspecified: Secondary | ICD-10-CM

## 2014-11-01 DIAGNOSIS — S0990XA Unspecified injury of head, initial encounter: Secondary | ICD-10-CM

## 2014-11-01 NOTE — Progress Notes (Signed)
Subjective:    Patient ID: Maurice Roth, male    DOB: 28-Apr-1991, 24 y.o.   MRN: [id number]  HPI  A Caucasian 24yo male presents to the office today for follow up for I&D that was performed on 10/29/14.  Patient states he has been changing the guaze daily and pulling the iodoform out every day a couple of mm.  Patient reports passing out while pulling on iodoform 2 days ago and reports LOC.  He hit his head on table on the left back of his head.  He passed out while at neighbors house and they called his name and said he was only out for about 35 seconds.  He states he did not remember where he was immediately, but then did remember.  He reports headache and dizziness after passing out.  He has been taking the Norco tablets as prescribed and denies alcohol and drug use.  He is still taking the doxycycline as prescribed and been on them for 7 days and due to finish 11/08/14.  He denies exercise, but states he has been walking in the woods since last visit.  He denies fever, chills, sweats, fatigue, photophobia, visual changes, SOB, CP, abdominal pain, nausea, vomiting, diarrhea, constipation, incontinence, dizziness, headaches, lightheadedness, vertigo, confusion.  GFR= >60 on 09/02/2008 Review of Systems  Constitutional: Negative.  Negative for fever, chills, diaphoresis and fatigue.  HENT: Negative.   Eyes: Negative.  Negative for photophobia and visual disturbance.  Respiratory: Negative.   Cardiovascular: Negative.   Gastrointestinal: Negative.   Musculoskeletal: Negative.   Skin: Positive for color change. Negative for rash.       Skin is red and swollen on left upper thigh, but has improve since last visit  Neurological: Negative.  Negative for dizziness, light-headedness and headaches.       Denies Vertigo  Psychiatric/Behavioral: Negative.  Negative for confusion.   Past Medical History  Diagnosis Date  . 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 ADD 02/17/2009  . GERD 06/22/2009   Current  Outpatient Prescriptions on File Prior to Visit  Medication Sig Dispense Refill  . doxycycline (VIBRA-TABS) 100 MG tablet Take 1 tablet (100 mg total) by mouth 2 (two) times daily. For 14 days 28 tablet 0  . HYDROcodone-acetaminophen (NORCO) 5-325 MG per tablet Take 1 tablet by mouth every 6 (six) hours as needed for moderate pain. 60 tablet 0   No current facility-administered medications on file prior to visit.   No Known Allergies    BP 124/70 mmHg  Pulse 60  Temp(Src) 98.6 F (37 C) (Temporal)  Resp 18  Ht 6' (1.829 m) Wt Readings from Last 3 Encounters:  10/29/14 188 lb (85.276 kg)  10/26/14 185 lb (83.915 kg)  06/08/14 184 lb (83.462 kg)   Objective:   Physical Exam  Constitutional: He is oriented to person, place, and time. He appears well-developed and well-nourished. He does not have a sickly appearance. No distress.  HENT:  Head: Normocephalic.  Eyes: Conjunctivae, EOM and lids are normal. Pupils are equal, round, and reactive to light. Right eye exhibits no discharge. Left eye exhibits no discharge. No scleral icterus.  Neck: Normal range of motion and phonation normal. Neck supple.  Cardiovascular: Normal rate, regular rhythm, S1 normal, S2 normal, normal heart sounds, intact distal pulses and normal pulses.  Exam reveals no gallop, no distant heart sounds and no friction rub.   No murmur heard. Pulmonary/Chest: Effort normal and breath sounds normal. No stridor. No respiratory distress. He has no decreased  breath sounds. He has no wheezes. He has no rhonchi. He has no rales. He exhibits no tenderness.  Abdominal: Soft. Bowel sounds are normal.  Lymphadenopathy:  No tenderness or LAD.  Neurological: He is alert and oriented to person, place, and time. He has normal strength. No cranial nerve deficit or sensory deficit. Gait normal.  Skin: Skin is warm and dry. Laceration and rash noted. Rash is macular. He is not diaphoretic. There is erythema. No pallor.       Erythematous macular rash on left occipital area of scalp.  Non-pruritic.   Psychiatric: He has a normal mood and affect. His speech is normal and behavior is normal. Judgment and thought content normal. Cognition and memory are normal.  Vitals reviewed.   Assessment & Plan:  1. Abscess and Cellulitis of leg, left -Continue Doxycycline as prescribed -Continue Norco- only take half tablet at a time. -Please rinse wound with hydrogen peroxide 2-3 times a day.    2. Head trauma, initial encounter Ordered CT head to R/O contusion or hematoma. - CT Head Wo Contrast; Future  3. Tinea Capitis -Please take Lamisil OTC- Please follow directions on container.  Told patient to go to ED immediately if he notices more redness, swelling, pain, red streaks and if he develops fever, chills, sweats, fatigue, swollen bump in infected area, vomiting, diarrhea, muscle aches.  Spoke with Dr. Melford Aase and he looked at patient's wound and said to no longer pack with iodoform and have patient rinse wound with hydrogen peroxide.  Discussed medication effects and SE's.  Pt agreed to treatment plan. Please follow up on Wednesday for recheck.  Kharon Hixon, Stephani Police, PA-C 6:54 PM [facility]

## 2014-11-01 NOTE — Patient Instructions (Addendum)
-  Please rinse with Hydrogen Peroxide about 2-3 times a day. -Please make sure you are drinking plenty of water to stay hydrated. - Please only take half pain medication at time -Please take Zyrtec over the counter- 1 tablet at bedtime for itchiness. -Please use over the counter Lamisil for redness on left back of head for fungal infection.   Please follow up in 2 days.   Abscess Care After An abscess (also called a boil or furuncle) is an infected area that contains a collection of pus. Signs and symptoms of an abscess include pain, tenderness, redness, or hardness, or you may feel a moveable soft area under your skin. An abscess can occur anywhere in the body. The infection may spread to surrounding tissues causing cellulitis. A cut (incision) by the surgeon was made over your abscess and the pus was drained out. Gauze may have been packed into the space to provide a drain that will allow the cavity to heal from the inside outwards. The boil may be painful for 5 to 7 days. Most people with a boil do not have high fevers. Your abscess, if seen early, may not have localized, and may not have been lanced. If not, another appointment may be required for this if it does not get better on its own or with medications. HOME CARE INSTRUCTIONS   Only take over-the-counter or prescription medicines for pain, discomfort, or fever as directed by your caregiver.  When you bathe, soak and then remove gauze or iodoform packs at least daily or as directed by your caregiver. You may then wash the wound gently with mild soapy water. Repack with gauze or do as your caregiver directs. SEEK IMMEDIATE MEDICAL CARE IF:   You develop increased pain, swelling, redness, drainage, or bleeding in the wound site.  You develop signs of generalized infection including muscle aches, chills, fever, or a general ill feeling.  An oral temperature above 102 F (38.9 C) develops, not controlled by medication. See your caregiver  for a recheck if you develop any of the symptoms described above. If medications (antibiotics) were prescribed, take them as directed. Document Released: 04/26/2005 Document Revised: 12/31/2011 Document Reviewed: 12/22/2007 [facility] Patient Information 2015 [place], [place]. This information is not intended to replace advice given to you by your health care provider. Make sure you discuss any questions you have with your health care provider.

## 2014-11-02 ENCOUNTER — Inpatient Hospital Stay: Admission: RE | Admit: 2014-11-02 | Payer: Self-pay | Source: Ambulatory Visit

## 2014-11-03 ENCOUNTER — Ambulatory Visit: Payer: Self-pay | Admitting: Physician Assistant

## 2014-11-04 ENCOUNTER — Ambulatory Visit: Payer: 59 | Admitting: Physician Assistant

## 2014-11-04 ENCOUNTER — Encounter: Payer: Self-pay | Admitting: Physician Assistant

## 2014-11-04 VITALS — BP 126/68 | HR 60 | Temp 98.6°F | Resp 18 | Ht 72.0 in

## 2014-11-04 DIAGNOSIS — L03119 Cellulitis of unspecified part of limb: Principal | ICD-10-CM

## 2014-11-04 DIAGNOSIS — L02419 Cutaneous abscess of limb, unspecified: Secondary | ICD-10-CM

## 2014-11-04 NOTE — Progress Notes (Signed)
Subjective:    Patient ID: Maurice Roth, male    DOB: 02/07/1991, 24 y.o.   MRN: [id number]  HPI  A Caucasian 24yo male presents to the office today for follow up for I&D that was performed on 10/29/14.  Patient states he has only done hydrogen peroxide once since last appt. He states he has been hiking and walking a lot.  Patient did not go get CT scan done.  Told patient that I want him to get that done to make sure everything is OK since he passed out and hit his head. Patient is non-compliant and not following directions and that is why I keep bringing him back to check wound.  GFR= >60 on 09/02/2008 Review of Systems  Constitutional: Negative.  Negative for fever, chills, diaphoresis and fatigue.  HENT: Negative.   Eyes: Negative.  Negative for photophobia and visual disturbance.  Respiratory: Negative.  Negative for shortness of breath.   Cardiovascular: Negative.  Negative for chest pain.  Gastrointestinal: Negative.   Musculoskeletal: Negative.   Skin: Positive for color change. Negative for rash.       Skin is red and swollen on left upper thigh, but has improve since last visit  Neurological: Negative.  Negative for dizziness, light-headedness and headaches.       Denies Vertigo  Psychiatric/Behavioral: Negative.  Negative for confusion.   Past Medical History  Diagnosis Date  . 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 ADD 02/17/2009  . GERD 06/22/2009   Current Outpatient Prescriptions on File Prior to Visit  Medication Sig Dispense Refill  . doxycycline (VIBRA-TABS) 100 MG tablet Take 1 tablet (100 mg total) by mouth 2 (two) times daily. For 14 days 28 tablet 0  . HYDROcodone-acetaminophen (NORCO) 5-325 MG per tablet Take 1 tablet by mouth every 6 (six) hours as needed for moderate pain. 60 tablet 0   No current facility-administered medications on file prior to visit.   No Known Allergies    BP 126/68 mmHg  Pulse 60  Temp(Src) 98.6 F (37 C) (Temporal)  Resp 18  Ht 6' (1.829  m) Wt Readings from Last 3 Encounters:  10/29/14 188 lb (85.276 kg)  10/26/14 185 lb (83.915 kg)  06/08/14 184 lb (83.462 kg)   Objective:   Physical Exam  Constitutional: He is oriented to person, place, and time. He appears well-developed and well-nourished. He does not have a sickly appearance. No distress.  HENT:  Head: Normocephalic.  Eyes: Conjunctivae, EOM and lids are normal. Pupils are equal, round, and reactive to light. Right eye exhibits no discharge. Left eye exhibits no discharge. No scleral icterus.  Neck: Normal range of motion and phonation normal. Neck supple.  Cardiovascular: Normal rate, regular rhythm, S1 normal, S2 normal, normal heart sounds and normal pulses.  Exam reveals no gallop, no distant heart sounds and no friction rub.   No murmur heard. Pulmonary/Chest: Effort normal and breath sounds normal. No stridor.  Musculoskeletal: Normal range of motion.  Lymphadenopathy:  No tenderness or LAD.  Neurological: He is alert and oriented to person, place, and time. He has normal strength. No cranial nerve deficit or sensory deficit. Gait normal.  Skin: Skin is warm and dry. Laceration noted. He is not diaphoretic. There is erythema. No pallor.     Erythematous macular rash on left occipital area of scalp.  Non-pruritic.   Psychiatric: He has a normal mood and affect. His speech is normal and behavior is normal. Judgment and thought content normal. Cognition and memory are  normal.  Vitals reviewed.  Assessment & Plan:  1. Abscess and Cellulitis of leg, left -Continue Doxycycline as prescribed -Continue Norco- only take half tablet at a time. -Please rinse wound with hydrogen peroxide 2-3 times a day.   If you notice drainage, then please use bacitracin or neosporin over the counter. Can take showers to help wash infection.  2. Head trauma, initial encounter Spoke with patient about importance to go get CT scan of head.  Told patient to go to ED immediately if  he notices more redness, swelling, pain, red streaks and if he develops fever, chills, sweats, fatigue, swollen bump in infected area, vomiting, diarrhea, muscle aches.   Discussed medication effects and SE's.  Pt agreed to treatment plan. Please follow up in 1 week for recheck- should be last visit.  Yides Saidi, Stephani Police, PA-C 2:28 PM [facility]

## 2014-11-04 NOTE — Patient Instructions (Signed)
-Please keep doing the hydrogen peroxide 2-3 times a day. Please use neosporin or bacitracin if you notice pus drainage from wound. Continue doxycycline as prescribed.  Please follow up in 1 week.  Laceration Care, Adult A laceration is a cut or lesion that goes through all layers of the skin and into the tissue just beneath the skin. TREATMENT  Some lacerations may not require closure. Some lacerations may not be able to be closed due to an increased risk of infection. It is important to see your caregiver as soon as possible after an injury to minimize the risk of infection and maximize the opportunity for successful closure. If closure is appropriate, pain medicines may be given, if needed. The wound will be cleaned to help prevent infection. Your caregiver will use stitches (sutures), staples, wound glue (adhesive), or skin adhesive strips to repair the laceration. These tools bring the skin edges together to allow for faster healing and a better cosmetic outcome. However, all wounds will heal with a scar. Once the wound has healed, scarring can be minimized by covering the wound with sunscreen during the day for 1 full year. HOME CARE INSTRUCTIONS  For sutures or staples:  Keep the wound clean and dry.  If you were given a bandage (dressing), you should change it at least once a day. Also, change the dressing if it becomes wet or dirty, or as directed by your caregiver.  Wash the wound with soap and water 2 times a day. Rinse the wound off with water to remove all soap. Pat the wound dry with a clean towel.  After cleaning, apply a thin layer of the antibiotic ointment as recommended by your caregiver. This will help prevent infection and keep the dressing from sticking.  You may shower as usual after the first 24 hours. Do not soak the wound in water until the sutures are removed.  Only take over-the-counter or prescription medicines for pain, discomfort, or fever as directed by your  caregiver.  Get your sutures or staples removed as directed by your caregiver. For skin adhesive strips:  Keep the wound clean and dry.  Do not get the skin adhesive strips wet. You may bathe carefully, using caution to keep the wound dry.  If the wound gets wet, pat it dry with a clean towel.  Skin adhesive strips will fall off on their own. You may trim the strips as the wound heals. Do not remove skin adhesive strips that are still stuck to the wound. They will fall off in time. For wound adhesive:  You may briefly wet your wound in the shower or bath. Do not soak or scrub the wound. Do not swim. Avoid periods of heavy perspiration until the skin adhesive has fallen off on its own. After showering or bathing, gently pat the wound dry with a clean towel.  Do not apply liquid medicine, cream medicine, or ointment medicine to your wound while the skin adhesive is in place. This may loosen the film before your wound is healed.  If a dressing is placed over the wound, be careful not to apply tape directly over the skin adhesive. This may cause the adhesive to be pulled off before the wound is healed.  Avoid prolonged exposure to sunlight or tanning lamps while the skin adhesive is in place. Exposure to ultraviolet light in the first year will darken the scar.  The skin adhesive will usually remain in place for 5 to 10 days, then naturally fall off  the skin. Do not pick at the adhesive film. You may need a tetanus shot if:  You cannot remember when you had your last tetanus shot.  You have never had a tetanus shot. If you get a tetanus shot, your arm may swell, get red, and feel warm to the touch. This is common and not a problem. If you need a tetanus shot and you choose not to have one, there is a rare chance of getting tetanus. Sickness from tetanus can be serious. SEEK MEDICAL CARE IF:   You have redness, swelling, or increasing pain in the wound.  You see a red line that goes away  from the wound.  You have yellowish-white fluid (pus) coming from the wound.  You have a fever.  You notice a bad smell coming from the wound or dressing.  Your wound breaks open before or after sutures have been removed.  You notice something coming out of the wound such as wood or glass.  Your wound is on your hand or foot and you cannot move a finger or toe. SEEK IMMEDIATE MEDICAL CARE IF:   Your pain is not controlled with prescribed medicine.  You have severe swelling around the wound causing pain and numbness or a change in color in your arm, hand, leg, or foot.  Your wound splits open and starts bleeding.  You have worsening numbness, weakness, or loss of function of any joint around or beyond the wound.  You develop painful lumps near the wound or on the skin anywhere on your body. MAKE SURE YOU:   Understand these instructions.  Will watch your condition.  Will get help right away if you are not doing well or get worse. Document Released: 10/08/2005 Document Revised: 12/31/2011 Document Reviewed: 04/03/2011 [facility] Patient Information 2015 [place], [place]. This information is not intended to replace advice given to you by your health care provider. Make sure you discuss any questions you have with your health care provider.

## 2014-11-11 ENCOUNTER — Encounter: Payer: Self-pay | Admitting: Physician Assistant

## 2014-11-11 ENCOUNTER — Ambulatory Visit: Payer: Self-pay | Admitting: Physician Assistant

## 2014-11-11 VITALS — BP 106/78 | HR 78 | Temp 98.0°F | Resp 18 | Ht 72.0 in | Wt 188.0 lb

## 2014-11-11 DIAGNOSIS — S0990XD Unspecified injury of head, subsequent encounter: Secondary | ICD-10-CM

## 2014-11-11 DIAGNOSIS — L02419 Cutaneous abscess of limb, unspecified: Secondary | ICD-10-CM

## 2014-11-11 DIAGNOSIS — L03119 Cellulitis of unspecified part of limb: Principal | ICD-10-CM

## 2014-11-11 NOTE — Patient Instructions (Addendum)
-Please go get CT scan done at [place].  Ankle Sprain An ankle sprain is an injury to the strong, fibrous tissues (ligaments) that hold the bones of your ankle joint together.  CAUSES An ankle sprain is usually caused by a fall or by twisting your ankle. Ankle sprains most commonly occur when you step on the outer edge of your foot, and your ankle turns inward. People who participate in sports are more prone to these types of injuries.  SYMPTOMS   Pain in your ankle. The pain may be present at rest or only when you are trying to stand or walk.  Swelling.  Bruising. Bruising may develop immediately or within 1 to 2 days after your injury.  Difficulty standing or walking, particularly when turning corners or changing directions. DIAGNOSIS  Your caregiver will ask you details about your injury and perform a physical exam of your ankle to determine if you have an ankle sprain. During the physical exam, your caregiver will press on and apply pressure to specific areas of your foot and ankle. Your caregiver will try to move your ankle in certain ways. An X-ray exam may be done to be sure a bone was not broken or a ligament did not separate from one of the bones in your ankle (avulsion fracture).  TREATMENT  Certain types of braces can help stabilize your ankle. Your caregiver can make a recommendation for this. Your caregiver may recommend the use of medicine for pain. If your sprain is severe, your caregiver may refer you to a surgeon who helps to restore function to parts of your skeletal system (orthopedist) or a physical therapist. Dendron ice to your injury for 1-2 days or as directed by your caregiver. Applying ice helps to reduce inflammation and pain.  Put ice in a plastic bag.  Place a towel between your skin and the bag.  Leave the ice on for 15-20 minutes at a time, every 2 hours while you are awake.  Only take over-the-counter or prescription  medicines for pain, discomfort, or fever as directed by your caregiver.  Elevate your injured ankle above the level of your heart as much as possible for 2-3 days.  If your caregiver recommends crutches, use them as instructed. Gradually put weight on the affected ankle. Continue to use crutches or a cane until you can walk without feeling pain in your ankle.  If you have a plaster splint, wear the splint as directed by your caregiver. Do not rest it on anything harder than a pillow for the first 24 hours. Do not put weight on it. Do not get it wet. You may take it off to take a shower or bath.  You may have been given an elastic bandage to wear around your ankle to provide support. If the elastic bandage is too tight (you have numbness or tingling in your foot or your foot becomes cold and blue), adjust the bandage to make it comfortable.  If you have an air splint, you may blow more air into it or let air out to make it more comfortable. You may take your splint off at night and before taking a shower or bath. Wiggle your toes in the splint several times per day to decrease swelling. SEEK MEDICAL CARE IF:   You have rapidly increasing bruising or swelling.  Your toes feel extremely cold or you lose feeling in your foot.  Your pain is not relieved with medicine. SEEK IMMEDIATE  MEDICAL CARE IF:  Your toes are numb or blue.  You have severe pain that is increasing. MAKE SURE YOU:   Understand these instructions.  Will watch your condition.  Will get help right away if you are not doing well or get worse. Document Released: 10/08/2005 Document Revised: 07/02/2012 Document Reviewed: 10/20/2011 [facility] Patient Information 2015 [place], [place]. This information is not intended to replace advice given to you by your health care provider. Make sure you discuss any questions you have with your health care provider.

## 2014-11-11 NOTE — Progress Notes (Signed)
Subjective:    Patient ID: Maurice Roth, male    DOB: 07-25-1991, 24 y.o.   MRN: [id number]  HPI  A Caucasian 24yo male presents to the office today for follow up for I&D that was performed on 10/29/14.  Patient states he has been doing the hydrogen peroxide. He finished the Doxycycline completely and pain medication (Norco).  He states he is doing a lot better and is in no pain.  Patient was seen at urgent care for sprain ankle and is still having that issue.  He is going to start taking an NSAID and is wearing hiking boots for ankle support.  Patient did not go get CT scan done.  Told patient that I want him to get that done to make sure everything is OK since he passed out and hit his head.   Review of Systems  Constitutional: Negative.  Negative for fever, chills, diaphoresis and fatigue.  HENT: Negative.   Eyes: Negative.  Negative for photophobia and visual disturbance.  Respiratory: Negative.  Negative for shortness of breath.   Cardiovascular: Negative.  Negative for chest pain.  Gastrointestinal: Negative.   Musculoskeletal: Negative.   Skin: Negative for color change and rash.       Skin is slightly red and not swollen on left upper thigh and has improved since last visit.    Neurological: Negative.  Negative for dizziness, light-headedness and headaches.       Denies Vertigo  Psychiatric/Behavioral: Negative.  Negative for confusion.   Past Medical History  Diagnosis Date  . 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 ADD 02/17/2009  . GERD 06/22/2009   No current outpatient prescriptions on file prior to visit.   No current facility-administered medications on file prior to visit.   No Known Allergies    BP 106/78 mmHg  Pulse 78  Temp(Src) 98 F (36.7 C) (Temporal)  Resp 18  Ht 6' (1.829 m)  Wt 188 lb (85.276 kg)  BMI 25.49 kg/m2  SpO2 98% Wt Readings from Last 3 Encounters:  11/11/14 188 lb (85.276 kg)  10/29/14 188 lb (85.276 kg)  10/26/14 185 lb (83.915 kg)   Objective:   Physical Exam  Constitutional: He is oriented to person, place, and time. He appears well-developed and well-nourished. He does not have a sickly appearance. No distress.  HENT:  Head: Normocephalic.  Eyes: Conjunctivae, EOM and lids are normal. Right eye exhibits no discharge. Left eye exhibits no discharge. No scleral icterus.  Cardiovascular: Normal rate, regular rhythm, S1 normal, S2 normal, normal heart sounds and normal pulses.  Exam reveals no gallop, no distant heart sounds and no friction rub.   No murmur heard. Pulmonary/Chest: Effort normal and breath sounds normal.  Musculoskeletal: Normal range of motion.  Neurological: He is alert and oriented to person, place, and time. He has normal strength. No cranial nerve deficit or sensory deficit. Gait normal.  Skin: Skin is warm and dry. Laceration noted. He is not diaphoretic. No erythema. No pallor.     Psychiatric: He has a normal mood and affect. His speech is normal and behavior is normal. Judgment and thought content normal. Cognition and memory are normal.  Vitals reviewed.  Assessment & Plan:  1. Abscess and Cellulitis of leg, left Healed.  If you notice drainage, then please use bacitracin or neosporin over the counter.  2. Head trauma, subsequent encounter Spoke with patient about importance to go get CT scan of head.  Told patient to go to ED immediately if he notices  more redness, swelling, pain, red streaks and if he develops fever, chills, sweats, fatigue, swollen bump in infected area, vomiting, diarrhea, muscle aches, dizziness, lightheadedness, headaches, vertigo.   Discussed medication effects and SE's.  Pt agreed to treatment plan. Patient does not need to follow up unless not healed completely.    Kissy Cielo, Stephani Police, PA-C 1:50 PM [facility]

## 2015-07-09 ENCOUNTER — Emergency Department (HOSPITAL_COMMUNITY): Payer: BLUE CROSS/BLUE SHIELD

## 2015-07-09 ENCOUNTER — Encounter (HOSPITAL_COMMUNITY): Payer: Self-pay

## 2015-07-09 ENCOUNTER — Emergency Department (HOSPITAL_COMMUNITY)
Admission: EM | Admit: 2015-07-09 | Discharge: 2015-07-09 | Disposition: A | Discharge status: Home / Self Care | Payer: BLUE CROSS/BLUE SHIELD | Attending: Physician Assistant | Admitting: Physician Assistant

## 2015-07-09 DIAGNOSIS — Z872 Personal history of diseases of the skin and subcutaneous tissue: Secondary | ICD-10-CM | POA: Insufficient documentation

## 2015-07-09 DIAGNOSIS — Y9289 Other specified places as the place of occurrence of the external cause: Secondary | ICD-10-CM | POA: Diagnosis not present

## 2015-07-09 DIAGNOSIS — S1192XA Laceration with foreign body of unspecified part of neck, initial encounter: Secondary | ICD-10-CM | POA: Insufficient documentation

## 2015-07-09 DIAGNOSIS — Y998 Other external cause status: Secondary | ICD-10-CM | POA: Diagnosis not present

## 2015-07-09 DIAGNOSIS — R0789 Other chest pain: Secondary | ICD-10-CM | POA: Diagnosis not present

## 2015-07-09 DIAGNOSIS — W270XXA Contact with workbench tool, initial encounter: Secondary | ICD-10-CM | POA: Insufficient documentation

## 2015-07-09 DIAGNOSIS — M795 Residual foreign body in soft tissue: Secondary | ICD-10-CM

## 2015-07-09 DIAGNOSIS — Y9389 Activity, other specified: Secondary | ICD-10-CM | POA: Insufficient documentation

## 2015-07-09 DIAGNOSIS — Z8719 Personal history of other diseases of the digestive system: Secondary | ICD-10-CM | POA: Diagnosis not present

## 2015-07-09 DIAGNOSIS — Z8659 Personal history of other mental and behavioral disorders: Secondary | ICD-10-CM | POA: Diagnosis not present

## 2015-07-09 DIAGNOSIS — Z720 Tobacco use: Secondary | ICD-10-CM | POA: Insufficient documentation

## 2015-07-09 LAB — CBC WITH DIFFERENTIAL/PLATELET
BASOS ABS: 0 10*3/uL (ref 0.0–0.1)
Basophils Relative: 0 %
EOS ABS: 0.2 10*3/uL (ref 0.0–0.7)
EOS PCT: 2 %
HCT: 38.6 % — ABNORMAL LOW (ref 39.0–52.0)
Hemoglobin: 13.6 g/dL (ref 13.0–17.0)
LYMPHS PCT: 20 %
Lymphs Abs: 2 10*3/uL (ref 0.7–4.0)
MCH: 32.2 pg (ref 26.0–34.0)
MCHC: 35.2 g/dL (ref 30.0–36.0)
MCV: 91.3 fL (ref 78.0–100.0)
Monocytes Absolute: 0.8 10*3/uL (ref 0.1–1.0)
Monocytes Relative: 8 %
Neutro Abs: 6.9 10*3/uL (ref 1.7–7.7)
Neutrophils Relative %: 70 %
PLATELETS: 179 10*3/uL (ref 150–400)
RBC: 4.23 MIL/uL (ref 4.22–5.81)
RDW: 12 % (ref 11.5–15.5)
WBC: 9.9 10*3/uL (ref 4.0–10.5)

## 2015-07-09 LAB — BASIC METABOLIC PANEL
ANION GAP: 4 — AB (ref 5–15)
BUN: 21 mg/dL — AB (ref 6–20)
CO2: 27 mmol/L (ref 22–32)
Calcium: 9.3 mg/dL (ref 8.9–10.3)
Chloride: 107 mmol/L (ref 101–111)
Creatinine, Ser: 0.81 mg/dL (ref 0.61–1.24)
GFR calc Af Amer: >60 mL/min (ref >60)
Glucose, Bld: 82 mg/dL (ref 65–99)
POTASSIUM: 3.9 mmol/L (ref 3.5–5.1)
SODIUM: 138 mmol/L (ref 135–145)

## 2015-07-09 MED ORDER — OXYCODONE-ACETAMINOPHEN 5-325 MG PO TABS: 1.0000 | ORAL_TABLET | Freq: Four times a day (QID) | ORAL | Status: DC | PRN

## 2015-07-09 MED ORDER — IOHEXOL 300 MG/ML  SOLN
100.0000 mL | Freq: Once | INTRAMUSCULAR | Status: AC | PRN
  Administered 2015-07-09: 80 mL via INTRAVENOUS

## 2015-07-09 MED ORDER — CEPHALEXIN 500 MG PO CAPS
500.0000 mg | ORAL_CAPSULE | Freq: Once | ORAL | Status: AC
  Administered 2015-07-09: 500 mg via ORAL
  Filled 2015-07-09: qty 1

## 2015-07-09 MED ORDER — LIDOCAINE HCL 1 % IJ SOLN
30.0000 mL | Freq: Once | INTRAMUSCULAR | Status: DC
  Filled 2015-07-09: qty 40

## 2015-07-09 MED ORDER — CEPHALEXIN 500 MG PO CAPS: 500.0000 mg | ORAL_CAPSULE | Freq: Two times a day (BID) | ORAL | Status: DC

## 2015-07-09 NOTE — ED Notes (Signed)
He states he was struck by steel piece which broke off from a hammer being wielded by a coworker at about 0900 today.  He has a puncture wound at left mid supraclavicular area.  He states he was seen at a minor emerg. Prior to arrival who performed x-ray and sent him here.  He states he has expressed blood from this area "because it got real big and swollen and I kept squeezing blood out of it and the last time I did that I passed out and my buddy woke me up".  Currently he is awake, alert and in no distress with minimal edema at puncture site.

## 2015-07-09 NOTE — Discharge Instructions (Signed)
You will need to follow up with Dr. Redmond Baseman from ENT.  Call his office to see him this week.

## 2015-07-09 NOTE — ED Provider Notes (Addendum)
CSN: [id number]     Arrival date & time 07/09/15  1415 History   First MD Initiated Contact with Patient 07/09/15 1455     Chief Complaint  Patient presents with  . 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 Patient went to outside hospital and was found to have a metal foreign body buried in his left lateral neck. Unfortunately his CD with x-rays do not work on our  computers. Patient has no shortness of breath no trouble breathing or stridor.  Past Medical History  Diagnosis Date  . 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 ADD 02/17/2009  . GERD 06/22/2009   No past surgical history on file. Family History  Problem Relation Age of Onset  . Hypertension Father   . Arthritis Neg Hx     family hx  . Hyperlipidemia Neg Hx     family hx   Social History  Substance Use Topics  . Smoking status: Current Some Day Smoker  . Smokeless tobacco: Never Used  . Alcohol Use: 0.0 oz/week    0 Standard drinks or equivalent per week     Comment: occ    Review of Systems  Constitutional: Negative for activity change.  Eyes: Negative for pain.  Respiratory: Positive for chest tightness. Negative for cough and shortness of breath.   Cardiovascular: Negative for chest pain.  Gastrointestinal: Negative for abdominal pain.      Allergies  Review of patient's allergies indicates no known allergies.  Home Medications   Prior to Admission medications   Not on File   BP 124/41 mmHg  Pulse 69  Temp(Src) 98.3 F (36.8 C) (Oral)  Resp 16  SpO2 100% Physical Exam  Constitutional: He is oriented to person, place, and time. He appears well-nourished.  HENT:  Head: Normocephalic.  Mouth/Throat: Oropharynx is clear and moist.  Old bruising to left eye.  Eyes: Conjunctivae  are normal.  Neck: No tracheal deviation present.  Cardiovascular: Normal rate.   Pulmonary/Chest: Effort normal. No stridor. No respiratory distress.  Small entrance wound lateral to the sternal notch on the left-hand side.  Musculoskeletal: Normal range of motion. He exhibits no edema.  Neurological: He is oriented to person, place, and time. No cranial nerve deficit.  Skin: Skin is warm and dry. No rash noted. He is not diaphoretic.  Psychiatric: He has a normal mood and affect. His behavior is normal.  Nursing note and vitals reviewed.   ED Course  Procedures (including critical care time) Labs Review Labs Reviewed  BASIC METABOLIC PANEL  CBC WITH DIFFERENTIAL/PLATELET    Imaging Review No results found. I have personally reviewed and evaluated these images and lab results as part of my medical decision-making.   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 We will get CT neck at this time. The small entrance wound is at the level of  sternal notch. . Patient has no hard or soft signs of neck injury including no bruits, difficult speaking, SOB etc.     4:45 PM Discussed with trauma surgery. Discussed with ENT. They believe that there is no vital structures within 3 cm and therefore we can probe gently into  the 1 cm deep with metallic foreign bodies found.     Spent > 30 minutes trying to get FB out of patient's neck, without success..   INCISION AND DRAINAGE Performed by: Gardiner Sleeper Consent: Verbal consent obtained. Risks and benefits: risks, benefits and alternatives were discussed Type: abscess  Body area: neck  Anesthesia: local infiltration  Incision was made with a scalpel.  Local anesthetic: lidocaine 1% wepinephrine  Anesthetic total:9 ml   Blunt dissection to look for FB  Single stitch placed. 6-0 ethilon  placed    Courteney Julio Alm, MD 07/09/15 1945   7:46 PM Discussed with ENT, will have him follow up in clinic.  Courteney Julio Alm, MD 07/09/15 1946

## 2015-07-12 ENCOUNTER — Encounter (HOSPITAL_COMMUNITY): Payer: Self-pay

## 2015-07-12 NOTE — Progress Notes (Signed)
Confirmed with patient that he can take Keflex and Percocet in the morning with a small sip of water if needed.  Patient's mother Micholas Drumwright was updated with arrival time of 630am.  Verbalized understanding.

## 2015-07-13 ENCOUNTER — Encounter (HOSPITAL_COMMUNITY)
Admission: RE | Disposition: A | Discharge status: Home / Self Care | Payer: Self-pay | Source: Ambulatory Visit | Attending: Otolaryngology

## 2015-07-13 ENCOUNTER — Encounter (HOSPITAL_COMMUNITY): Payer: Self-pay | Admitting: Anesthesiology

## 2015-07-13 ENCOUNTER — Ambulatory Visit (HOSPITAL_COMMUNITY): Payer: BLUE CROSS/BLUE SHIELD | Admitting: Anesthesiology

## 2015-07-13 ENCOUNTER — Ambulatory Visit (HOSPITAL_COMMUNITY)
Admission: RE | Admit: 2015-07-13 | Discharge: 2015-07-13 | Disposition: A | Discharge status: Home / Self Care | Payer: BLUE CROSS/BLUE SHIELD | Source: Ambulatory Visit | Attending: Otolaryngology | Admitting: Otolaryngology

## 2015-07-13 DIAGNOSIS — M795 Residual foreign body in soft tissue: Secondary | ICD-10-CM | POA: Insufficient documentation

## 2015-07-13 DIAGNOSIS — K219 Gastro-esophageal reflux disease without esophagitis: Secondary | ICD-10-CM | POA: Diagnosis not present

## 2015-07-13 DIAGNOSIS — W458XXA Other foreign body or object entering through skin, initial encounter: Secondary | ICD-10-CM | POA: Diagnosis not present

## 2015-07-13 DIAGNOSIS — Y9269 Other specified industrial and construction area as the place of occurrence of the external cause: Secondary | ICD-10-CM | POA: Diagnosis not present

## 2015-07-13 DIAGNOSIS — Y9389 Activity, other specified: Secondary | ICD-10-CM | POA: Diagnosis not present

## 2015-07-13 DIAGNOSIS — Y990 Civilian activity done for income or pay: Secondary | ICD-10-CM | POA: Insufficient documentation

## 2015-07-13 DIAGNOSIS — W278XXA Contact with other nonpowered hand tool, initial encounter: Secondary | ICD-10-CM | POA: Diagnosis not present

## 2015-07-13 DIAGNOSIS — F17210 Nicotine dependence, cigarettes, uncomplicated: Secondary | ICD-10-CM | POA: Diagnosis not present

## 2015-07-13 HISTORY — PX: FOREIGN BODY REMOVAL: SHX962

## 2015-07-13 SURGERY — FOREIGN BODY REMOVAL ADULT
Anesthesia: General | Site: Neck

## 2015-07-13 MED ORDER — FENTANYL CITRATE (PF) 250 MCG/5ML IJ SOLN
INTRAMUSCULAR | Status: AC
  Filled 2015-07-13: qty 5

## 2015-07-13 MED ORDER — PHENYLEPHRINE HCL 10 MG/ML IJ SOLN
INTRAMUSCULAR | Status: DC | PRN
  Administered 2015-07-13: 80 ug via INTRAVENOUS

## 2015-07-13 MED ORDER — SUCCINYLCHOLINE CHLORIDE 20 MG/ML IJ SOLN
INTRAMUSCULAR | Status: AC
  Filled 2015-07-13: qty 1

## 2015-07-13 MED ORDER — FENTANYL CITRATE (PF) 100 MCG/2ML IJ SOLN
INTRAMUSCULAR | Status: DC | PRN
  Administered 2015-07-13: 100 ug via INTRAVENOUS

## 2015-07-13 MED ORDER — PROPOFOL 10 MG/ML IV BOLUS
INTRAVENOUS | Status: AC
  Filled 2015-07-13: qty 20

## 2015-07-13 MED ORDER — LIDOCAINE-EPINEPHRINE 1 %-1:100000 IJ SOLN
INTRAMUSCULAR | Status: AC
  Filled 2015-07-13: qty 1

## 2015-07-13 MED ORDER — EPHEDRINE SULFATE 50 MG/ML IJ SOLN
INTRAMUSCULAR | Status: AC
  Filled 2015-07-13: qty 1

## 2015-07-13 MED ORDER — LIDOCAINE HCL (CARDIAC) 20 MG/ML IV SOLN
INTRAVENOUS | Status: AC
  Filled 2015-07-13: qty 5

## 2015-07-13 MED ORDER — 0.9 % SODIUM CHLORIDE (POUR BTL) OPTIME
TOPICAL | Status: DC | PRN
  Administered 2015-07-13: 1000 mL

## 2015-07-13 MED ORDER — BACITRACIN ZINC 500 UNIT/GM EX OINT
TOPICAL_OINTMENT | CUTANEOUS | Status: AC
  Filled 2015-07-13: qty 28.35

## 2015-07-13 MED ORDER — PROMETHAZINE HCL 25 MG/ML IJ SOLN: 6.2500 mg | INTRAMUSCULAR | Status: DC | PRN

## 2015-07-13 MED ORDER — ROCURONIUM BROMIDE 50 MG/5ML IV SOLN
INTRAVENOUS | Status: AC
  Filled 2015-07-13: qty 1

## 2015-07-13 MED ORDER — HYDROMORPHONE HCL 1 MG/ML IJ SOLN: 0.2500 mg | INTRAMUSCULAR | Status: DC | PRN

## 2015-07-13 MED ORDER — MIDAZOLAM HCL 2 MG/2ML IJ SOLN
INTRAMUSCULAR | Status: AC
  Filled 2015-07-13: qty 4

## 2015-07-13 MED ORDER — CEFAZOLIN SODIUM-DEXTROSE 2-3 GM-% IV SOLR
INTRAVENOUS | Status: DC | PRN
  Administered 2015-07-13: 2 g via INTRAVENOUS

## 2015-07-13 MED ORDER — STERILE WATER FOR INJECTION IJ SOLN
INTRAMUSCULAR | Status: AC
  Filled 2015-07-13: qty 10

## 2015-07-13 MED ORDER — LACTATED RINGERS IV SOLN
INTRAVENOUS | Status: DC | PRN
  Administered 2015-07-13 (×2): via INTRAVENOUS

## 2015-07-13 MED ORDER — ONDANSETRON HCL 4 MG/2ML IJ SOLN
INTRAMUSCULAR | Status: AC
  Filled 2015-07-13: qty 2

## 2015-07-13 MED ORDER — MIDAZOLAM HCL 5 MG/5ML IJ SOLN
INTRAMUSCULAR | Status: DC | PRN
  Administered 2015-07-13: 2 mg via INTRAVENOUS

## 2015-07-13 MED ORDER — BACITRACIN ZINC 500 UNIT/GM EX OINT
TOPICAL_OINTMENT | CUTANEOUS | Status: DC | PRN
  Administered 2015-07-13: 1 via TOPICAL

## 2015-07-13 MED ORDER — LIDOCAINE-EPINEPHRINE 1 %-1:100000 IJ SOLN
INTRAMUSCULAR | Status: DC | PRN
  Administered 2015-07-13: 1 mL

## 2015-07-13 MED ORDER — CEFAZOLIN SODIUM-DEXTROSE 2-3 GM-% IV SOLR
INTRAVENOUS | Status: AC
Start: 2015-07-13 — End: 2015-07-13
  Filled 2015-07-13: qty 50

## 2015-07-13 MED ORDER — ONDANSETRON HCL 4 MG/2ML IJ SOLN
INTRAMUSCULAR | Status: DC | PRN
  Administered 2015-07-13: 4 mg via INTRAVENOUS

## 2015-07-13 MED ORDER — LIDOCAINE HCL (CARDIAC) 20 MG/ML IV SOLN
INTRAVENOUS | Status: DC | PRN
  Administered 2015-07-13: 100 mg via INTRAVENOUS

## 2015-07-13 MED ORDER — PROPOFOL 10 MG/ML IV BOLUS
INTRAVENOUS | Status: DC | PRN
  Administered 2015-07-13: 200 mg via INTRAVENOUS

## 2015-07-13 MED ORDER — CEFAZOLIN SODIUM-DEXTROSE 2-3 GM-% IV SOLR: 2000.0000 mg | Freq: Once | INTRAVENOUS | Status: DC

## 2015-07-13 MED ORDER — EPHEDRINE SULFATE 50 MG/ML IJ SOLN
INTRAMUSCULAR | Status: DC | PRN
  Administered 2015-07-13: 10 mg via INTRAVENOUS

## 2015-07-13 SURGICAL SUPPLY — 48 items
ATTRACTOMAT 16X20 MAGNETIC DRP (DRAPES) IMPLANT
BLADE SURG 15 STRL LF DISP TIS (BLADE) IMPLANT
BLADE SURG 15 STRL SS (BLADE)
BNDG GAUZE ELAST 4 BULKY (GAUZE/BANDAGES/DRESSINGS) IMPLANT
CANISTER SUCTION 2500CC (MISCELLANEOUS) ×3 IMPLANT
CLEANER TIP ELECTROSURG 2X2 (MISCELLANEOUS) ×3 IMPLANT
CONT SPEC 4OZ CLIKSEAL STRL BL (MISCELLANEOUS) ×3 IMPLANT
CORDS BIPOLAR (ELECTRODE) IMPLANT
COVER SURGICAL LIGHT HANDLE (MISCELLANEOUS) ×3 IMPLANT
DRAIN CHANNEL 7F FF FLAT (WOUND CARE) IMPLANT
DRAIN PENROSE 1/4X12 LTX STRL (WOUND CARE) IMPLANT
DRAPE ORTHO SPLIT 77X108 STRL (DRAPES) ×3
DRAPE PROXIMA HALF (DRAPES) ×2 IMPLANT
DRAPE SURG ORHT 6 SPLT 77X108 (DRAPES) IMPLANT
DRSG TELFA 3X8 NADH (GAUZE/BANDAGES/DRESSINGS) ×3 IMPLANT
ELECT COATED BLADE 2.86 ST (ELECTRODE) ×3 IMPLANT
ELECT REM PT RETURN 9FT ADLT (ELECTROSURGICAL) ×3
ELECT REM PT RETURN 9FT PED (ELECTROSURGICAL)
ELECTRODE REM PT RETRN 9FT PED (ELECTROSURGICAL) IMPLANT
ELECTRODE REM PT RTRN 9FT ADLT (ELECTROSURGICAL) ×1 IMPLANT
EVACUATOR SILICONE 100CC (DRAIN) IMPLANT
GAUZE SPONGE 4X4 12PLY STRL (GAUZE/BANDAGES/DRESSINGS) ×2 IMPLANT
GLOVE BIOGEL M 7.0 STRL (GLOVE) ×6 IMPLANT
GOWN STRL REUS W/ TWL LRG LVL3 (GOWN DISPOSABLE) ×2 IMPLANT
GOWN STRL REUS W/TWL LRG LVL3 (GOWN DISPOSABLE) ×6
KIT BASIN OR (CUSTOM PROCEDURE TRAY) ×3 IMPLANT
KIT ROOM TURNOVER OR (KITS) ×3 IMPLANT
LOCATOR NERVE 3 VOLT (DISPOSABLE) IMPLANT
NDL HYPO 25GX1X1/2 BEV (NEEDLE) IMPLANT
NEEDLE HYPO 25GX1X1/2 BEV (NEEDLE) IMPLANT
NS IRRIG 1000ML POUR BTL (IV SOLUTION) ×3 IMPLANT
PAD ARMBOARD 7.5X6 YLW CONV (MISCELLANEOUS) ×6 IMPLANT
PAD DRESSING TELFA 3X8 NADH (GAUZE/BANDAGES/DRESSINGS) IMPLANT
PENCIL BUTTON HOLSTER BLD 10FT (ELECTRODE) ×3 IMPLANT
STAPLER VISISTAT 35W (STAPLE) ×3 IMPLANT
SUT ETHILON 3 0 PS 1 (SUTURE) IMPLANT
SUT ETHILON 5 0 PS 2 18 (SUTURE) ×2 IMPLANT
SUT SILK 2 0 FS (SUTURE) IMPLANT
SUT SILK 3 0 REEL (SUTURE) IMPLANT
SUT VIC AB 3-0 PS2 18 (SUTURE)
SUT VIC AB 3-0 PS2 18XBRD (SUTURE) IMPLANT
SUT VIC AB 4-0 P-3 18X BRD (SUTURE) IMPLANT
SUT VIC AB 4-0 P3 18 (SUTURE) ×3
SWAB COLLECTION DEVICE MRSA (MISCELLANEOUS) IMPLANT
TOWEL OR 17X24 6PK STRL BLUE (TOWEL DISPOSABLE) ×3 IMPLANT
TRAY ENT MC OR (CUSTOM PROCEDURE TRAY) ×1 IMPLANT
TUBE ANAEROBIC SPECIMEN COL (MISCELLANEOUS) IMPLANT
WATER STERILE IRR 1000ML POUR (IV SOLUTION) ×1 IMPLANT

## 2015-07-13 NOTE — Brief Op Note (Signed)
07/13/2015  9:36 AM  PATIENT:  Maurice Roth  24 y.o. male  PRE-OPERATIVE DIAGNOSIS:  puncture wound of neck with foreign body  POST-OPERATIVE DIAGNOSIS:  puncture wound of neck with foreign body  PROCEDURE:  EXPLORATION AND REMOVAL LEFT NECK FOREIGN BODY  SURGEON:  Surgeon(s) and Role:    * Jerrell Belfast, MD - Primary  PHYSICIAN ASSISTANT:   ASSISTANTS: none   ANESTHESIA:   general  EBL:  Total I/O In: 1000 [I.V.:1000] Out: - Min  BLOOD ADMINISTERED:none  DRAINS: none   LOCAL MEDICATIONS USED:  LIDOCAINE  and Amount: 1 ml  SPECIMEN:  No Specimen  DISPOSITION OF SPECIMEN: None  COUNTS:  YES  TOURNIQUET:  * No tourniquets in log *  DICTATION: .Other Dictation: Dictation Number [id number]  PLAN OF CARE: Discharge to home after PACU  PATIENT DISPOSITION:  PACU - hemodynamically stable.   Delay start of Pharmacological VTE agent (>24hrs) due to surgical blood loss or risk of bleeding: not applicable

## 2015-07-13 NOTE — Transfer of Care (Signed)
Immediate Anesthesia Transfer of Care Note  Patient: Maurice Roth  Procedure(s) Performed: Procedure(s): EXPLORATION AND REMOVAL OF SOFT TISSUE FOREIGN BODY  (N/A)  Patient Location: PACU  Anesthesia Type:General  Level of Consciousness: awake  Airway & Oxygen Therapy: Patient Spontanous Breathing  Post-op Assessment: Report given to RN and Post -op Vital signs reviewed and stable  Post vital signs: Reviewed and stable  Last Vitals:  Filed Vitals:   07/13/15 0935  BP: 124/67  Pulse: 52  Temp: 36.4 C  Resp: 14    Complications: No apparent anesthesia complications

## 2015-07-13 NOTE — Anesthesia Preprocedure Evaluation (Signed)
Anesthesia Evaluation  Patient identified by MRN, date of birth, ID band Patient awake    Reviewed: Allergy & Precautions, NPO status , Patient's Chart, lab work & pertinent test results  History of Anesthesia Complications Negative for: history of anesthetic complications  Airway Mallampati: I  TM Distance: >3 FB Neck ROM: Full    Dental  (+) Teeth Intact, Dental Advisory Given   Pulmonary Current Smoker,    Pulmonary exam normal breath sounds clear to auscultation       Cardiovascular Exercise Tolerance: Good (-) hypertensionnegative cardio ROS Normal cardiovascular exam Rhythm:Regular Rate:Normal     Neuro/Psych negative neurological ROS  negative psych ROS   GI/Hepatic Neg liver ROS, GERD  ,  Endo/Other  negative endocrine ROS  Renal/GU negative Renal ROS     Musculoskeletal negative musculoskeletal ROS (+)   Abdominal   Peds  (+) ADHD Hematology negative hematology ROS (+)   Anesthesia Other Findings Day of surgery medications reviewed with the patient.  Reproductive/Obstetrics                             Anesthesia Physical Anesthesia Plan  ASA: II  Anesthesia Plan: General   Post-op Pain Management:    Induction: Intravenous  Airway Management Planned: LMA  Additional Equipment:   Intra-op Plan:   Post-operative Plan: Extubation in OR  Informed Consent: I have reviewed the patients History and Physical, chart, labs and discussed the procedure including the risks, benefits and alternatives for the proposed anesthesia with the patient or authorized representative who has indicated his/her understanding and acceptance.   Dental advisory given  Plan Discussed with: CRNA  Anesthesia Plan Comments: (Risks/benefits of general anesthesia discussed with patient including risk of damage to teeth, lips, gum, and tongue, nausea/vomiting, allergic reactions to medications,  and the possibility of heart attack, stroke and death.  All patient questions answered.  Patient wishes to proceed.)        Anesthesia Quick Evaluation

## 2015-07-13 NOTE — Op Note (Signed)
NAMEIAM, LIPSON NO.:  [id number]  MEDICAL RECORD NO.:  [id number]  LOCATION:  [facility]                         FACILITY:  [facility]  PHYSICIAN:  Early Chars. Wilburn Cornelia, M.D.DATE OF BIRTH:  09-01-1991  DATE OF PROCEDURE:  07/13/2015 DATE OF DISCHARGE:                              OPERATIVE REPORT   PREOPERATIVE DIAGNOSIS:  Metallic foreign body, left lateral neck.  POSTOPERATIVE DIAGNOSIS:  Metallic foreign body, left lateral neck.  INDICATION FOR SURGERY:  Metallic foreign body, left lateral neck.  PROCEDURE:  Exploration and removal of metallic foreign body.  ANESTHESIA:  General endotracheal.  SURGEON:  Early Chars. Wilburn Cornelia, MD  COMPLICATIONS:  None.  ESTIMATED BLOOD LOSS:  Minimal.  The patient transferred from the operating room to the recovery room in stable condition.  BRIEF HISTORY:  The patient is a healthy 24 year old, white male, who is employed as a Probation officer.  He struck a metal piece with a hammer and a small metallic fragment struck him in the anterior neck.  The patient had acute swelling, pain, and bleeding and was seen in the emergency department.  Evaluation at that time, showed a small entrance wound.  CT scan of the neck and chest showed a metallic foreign body imbedded in the deep soft tissue of the left inferior neck.  No apparent airway or vascular concerns.  The patient was treated with antibiotics and referred to [facility] for additional workup.  Evaluation in the office showed a small entrance wound without evidence of swelling, erythema, or infection.  There was no palpable mass and the metallic foreign body was not apparent except for the above CT findings.  Given the patient's history, examination, and findings I recommended that we undertake exploration of the left neck with removal of the metallic foreign body.  The risks and benefits of procedure were discussed in detail with the patient's father and they understood and  concurred with our plan for surgery which is scheduled on elective basis at [facility] under general anesthesia.  DESCRIPTION OF PROCEDURE:  The patient was brought to the operating room, placed in supine position on the operating table.  General endotracheal anesthesia was established without difficulty.  When the patient was adequately anesthetized, he was positioned and prepped and draped.  A surgical time-out was then performed with correct identification of the patient, the proposed surgical procedure, and the laterality of the operation.  The patient was prepped and draped and prepared for surgery.  He was injected with 1 mL of 1% lidocaine 1:100,000 solution of epinephrine which was injected in a subcutaneous fashion in the left inferior lateral neck.  The previously closed entrance wound sutures were removed and the wound was then gently probed to follow the tract of the foreign body.  The patient's preoperative CT scan was reviewed in detail and the approximate location of the metallic foreign body was identified.  Using a #15 scalpel blade, a 1 cm horizontally oriented incision was created in the low anterior lateral left neck, this was carried through the skin underlying subcutaneous tissue into the superficial compartment of the neck.  The foreign body was not immediately evident and careful probing and  examination revealed a much deeper tract that extended into the medial aspect of the sternocleidomastoid muscle.  This was carefully dissected.  A small amount of the muscle was divided and the foreign body was identified within the substance of the sternocleidomastoid muscle.  This was removed and no evidence of additional foreign body or trauma.  The patient's wound was copiously irrigated with sterile saline.  There was no evidence of additional bleeding or infection.  The wound was then closed in layers beginning with the, incision, incisional closure  with interrupted 4-0 Vicryl suture to reapproximate the sternocleidomastoid muscle.  Deep subcutaneous closure with interrupted 4-0 Vicryl and final skin edge with interrupted 5-0 Ethilon.  The entrance wound was then re-irrigated and examined.  There was no evidence of infection or further injury and the wound there was closed again with 1 interrupted 4-0 Vicryl suture followed by interrupted 5-0 Ethilon.  The wound was dressed with bacitracin ointment and an external dressing consisting of Telfa, 4x4 gauze, and Hypafix tape was placed over the incision.  The patient was then awakened from his anesthetic. He was extubated and transferred from the operating room to recovery room in stable condition.  There were no complications.  Estimated blood loss was minimal.          ______________________________ Early Chars. Wilburn Cornelia, M.D.     DLS/MEDQ  D:  38/18/2993  T:  07/13/2015  Job:  [id number]

## 2015-07-13 NOTE — H&P (Signed)
Maurice Roth is an 24 y.o. male.   Chief Complaint: FB Left ant neck HPI: Pt stuck in neck by metallic FB, FB Left ant neck.  Past Medical History  Diagnosis Date  . 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 ADD 02/17/2009  . GERD 06/22/2009    Past Surgical History  Procedure Laterality Date  . Wisdom tooth extraction      Family History  Problem Relation Age of Onset  . Hypertension Father   . Arthritis Neg Hx     family hx  . Hyperlipidemia Neg Hx     family hx   Social History:  reports that he has been smoking.  He has never used smokeless tobacco. He reports that he drinks alcohol. He reports that he does not use illicit drugs.  Allergies: No Known Allergies  Medications Prior to Admission  Medication Sig Dispense Refill  . cephALEXin (KEFLEX) 500 MG capsule Take 1 capsule (500 mg total) by mouth 2 (two) times daily. 20 capsule 0  . oxyCODONE-acetaminophen (PERCOCET/ROXICET) 5-325 MG per tablet Take 1 tablet by mouth every 6 (six) hours as needed for severe pain. 11 tablet 0    No results found for this or any previous visit (from the past 48 hour(s)). No results found.  Review of Systems  Constitutional: Negative.   HENT: Negative.   Respiratory: Negative.   Cardiovascular: Negative.   Gastrointestinal: Negative.     Blood pressure 103/44, pulse 75, temperature 97.9 F (36.6 C), temperature source Oral, resp. rate 18, height 6' (1.829 m), weight 83.915 kg (185 lb), SpO2 100 %. Physical Exam  Constitutional: He appears well-developed and well-nourished.  Neck: Normal range of motion. Neck supple.  Laceration Lt ant neck  Cardiovascular: Normal rate.   Respiratory: Effort normal.  GI: Soft.     Assessment/Plan Adm for OP exploration and removal FB.   [place], DAVID 07/13/2015, 8:22 AM

## 2015-07-13 NOTE — Anesthesia Procedure Notes (Signed)
Procedure Name: LMA Insertion Date/Time: 07/13/2015 8:39 AM Performed by: Manus Gunning, HAL J Pre-anesthesia Checklist: Patient identified, Emergency Drugs available, Timeout performed, Suction available and Patient being monitored Patient Re-evaluated:Patient Re-evaluated prior to inductionOxygen Delivery Method: Circle system utilized Preoxygenation: Pre-oxygenation with 100% oxygen Intubation Type: IV induction Ventilation: Mask ventilation without difficulty LMA: LMA inserted LMA Size: 5.0 Number of attempts: 1 Placement Confirmation: positive ETCO2 and breath sounds checked- equal and bilateral Tube secured with: Tape Dental Injury: Teeth and Oropharynx as per pre-operative assessment

## 2015-07-14 ENCOUNTER — Encounter (HOSPITAL_COMMUNITY): Payer: Self-pay | Admitting: Otolaryngology

## 2015-07-14 NOTE — Anesthesia Postprocedure Evaluation (Signed)
  Anesthesia Post-op Note  Patient: Maurice Roth  Procedure(s) Performed: Procedure(s) (LRB): EXPLORATION AND REMOVAL OF SOFT TISSUE FOREIGN BODY  (N/A)  Patient Location: PACU  Anesthesia Type: General  Level of Consciousness: awake and alert   Airway and Oxygen Therapy: Patient Spontanous Breathing  Post-op Pain: mild  Post-op Assessment: Post-op Vital signs reviewed, Patient's Cardiovascular Status Stable, Respiratory Function Stable, Patent Airway and No signs of Nausea or vomiting  Last Vitals:  Filed Vitals:   07/13/15 1017  BP: 106/59  Pulse: 41  Temp:   Resp:     Post-op Vital Signs: stable   Complications: No apparent anesthesia complications

## 2015-11-21 ENCOUNTER — Ambulatory Visit (INDEPENDENT_AMBULATORY_CARE_PROVIDER_SITE_OTHER): Payer: BLUE CROSS/BLUE SHIELD | Admitting: Internal Medicine

## 2015-11-21 VITALS — BP 126/64 | HR 88 | Temp 98.2°F | Resp 18 | Ht 72.0 in | Wt 186.0 lb

## 2015-11-21 DIAGNOSIS — L03115 Cellulitis of right lower limb: Secondary | ICD-10-CM

## 2015-11-21 MED ORDER — DOXYCYCLINE HYCLATE 100 MG PO CAPS: 100.0000 mg | ORAL_CAPSULE | Freq: Two times a day (BID) | ORAL | Status: AC

## 2015-11-21 MED ORDER — CEPHALEXIN 500 MG PO CAPS: 500.0000 mg | ORAL_CAPSULE | Freq: Four times a day (QID) | ORAL | Status: AC

## 2015-11-21 NOTE — Patient Instructions (Signed)
Please take both the keflex and the doxycycline until they are gone.  Please use warm compresses on the area as often as you can tolerate.  You can take tylenol or ibuprofen on an as needed basis for pain.  If you take your pain medication at home please do not drive within 4 hours.  Please call the office if you are having worsening fevers, spreading redness, nausea, vomiting, or any other concerning symptoms.    Cellulitis Cellulitis is an infection of the skin and the tissue beneath it. The infected area is usually red and tender. Cellulitis occurs most often in the arms and lower legs.  CAUSES  Cellulitis is caused by bacteria that enter the skin through cracks or cuts in the skin. The most common types of bacteria that cause cellulitis are staphylococci and streptococci. SIGNS AND SYMPTOMS   Redness and warmth.  Swelling.  Tenderness or pain.  Fever. DIAGNOSIS  Your health care provider can usually determine what is wrong based on a physical exam. Blood tests may also be done. TREATMENT  Treatment usually involves taking an antibiotic medicine. HOME CARE INSTRUCTIONS   Take your antibiotic medicine as directed by your health care provider. Finish the antibiotic even if you start to feel better.  Keep the infected arm or leg elevated to reduce swelling.  Apply a warm cloth to the affected area up to 4 times per day to relieve pain.  Take medicines only as directed by your health care provider.  Keep all follow-up visits as directed by your health care provider. SEEK MEDICAL CARE IF:   You notice red streaks coming from the infected area.  Your red area gets larger or turns dark in color.  Your bone or joint underneath the infected area becomes painful after the skin has healed.  Your infection returns in the same area or another area.  You notice a swollen bump in the infected area.  You develop new symptoms.  You have a fever. SEEK IMMEDIATE MEDICAL CARE IF:     You feel very sleepy.  You develop vomiting or diarrhea.  You have a general ill feeling (malaise) with muscle aches and pains.   This information is not intended to replace advice given to you by your health care provider. Make sure you discuss any questions you have with your health care provider.   Document Released: 07/18/2005 Document Revised: 06/29/2015 Document Reviewed: 12/24/2011 Elsevier Interactive Patient Education [employer].

## 2015-11-21 NOTE — Progress Notes (Signed)
   Subjective:    Patient ID: Maurice Roth, male    DOB: [birth year], 25 y.o.   MRN: [id number]  HPI  Patient presents to the office for evaluation of a possible abscess on his right leg x 3 days.  He does have a history of abscess and had one on his left leg last year.  He reports some redness and swelling.  He reports that he is having some pain from it.  It is not severe at this point.  He has tried no relieving factors.  He reports that he had no injury to it that he can think of.  He has not had any fevers chills, nausea, vomiting.  He has had no drainage.    Review of Systems  Constitutional: Negative for fever and chills.  Gastrointestinal: Negative for nausea and vomiting.  Skin: Positive for color change and rash. Negative for pallor and wound.       Objective:   Physical Exam  Constitutional: He is oriented to person, place, and time. He appears well-developed and well-nourished. No distress.  HENT:  Head: Normocephalic.  Mouth/Throat: Oropharynx is clear and moist. No oropharyngeal exudate.  Eyes: Conjunctivae are normal. No scleral icterus.  Neck: Normal range of motion. Neck supple.  Cardiovascular: Normal rate, regular rhythm, normal heart sounds and intact distal pulses.  Exam reveals no gallop and no friction rub.   No murmur heard. Pulmonary/Chest: Effort normal and breath sounds normal. No respiratory distress. He has no wheezes. He has no rales. He exhibits no tenderness.  Musculoskeletal: Normal range of motion.  Neurological: He is alert and oriented to person, place, and time.  Skin: Skin is warm and dry. He is not diaphoretic.     Psychiatric: He has a normal mood and affect. His behavior is normal. Judgment and thought content normal.  Nursing note and vitals reviewed.   Filed Vitals:   11/21/15 1135  BP: 126/64  Pulse: 88  Temp: 98.2 F (36.8 C)  Resp: 18        Assessment & Plan:    1. Cellulitis of leg, right -no palpable abscess on  examination today -warm compresses as often as tolerated -elevate leg - cephALEXin (KEFLEX) 500 MG capsule; Take 1 capsule (500 mg total) by mouth 4 (four) times daily.  Dispense: 40 capsule; Refill: 0 - doxycycline (VIBRAMYCIN) 100 MG capsule; Take 1 capsule (100 mg total) by mouth 2 (two) times daily. One po bid x 7 days  Dispense: 14 capsule; Refill: 0 -offered pain medication which patient declined -patient to finish abx.  If worsening symptoms of fever, spreading redness, nausea, vomiting, or abscess formation to come back into the office and likely will need I&D.  Patient states understanding.

## 2017-06-14 ENCOUNTER — Emergency Department (HOSPITAL_COMMUNITY): Payer: BLUE CROSS/BLUE SHIELD

## 2017-06-14 ENCOUNTER — Encounter (HOSPITAL_COMMUNITY): Payer: Self-pay | Admitting: Emergency Medicine

## 2017-06-14 ENCOUNTER — Emergency Department (HOSPITAL_COMMUNITY)
Admission: EM | Admit: 2017-06-14 | Discharge: 2017-06-14 | Disposition: A | Discharge status: Home / Self Care | Payer: BLUE CROSS/BLUE SHIELD | Attending: Emergency Medicine | Admitting: Emergency Medicine

## 2017-06-14 DIAGNOSIS — J390 Retropharyngeal and parapharyngeal abscess: Secondary | ICD-10-CM | POA: Insufficient documentation

## 2017-06-14 DIAGNOSIS — F17200 Nicotine dependence, unspecified, uncomplicated: Secondary | ICD-10-CM | POA: Insufficient documentation

## 2017-06-14 DIAGNOSIS — J36 Peritonsillar abscess: Secondary | ICD-10-CM

## 2017-06-14 DIAGNOSIS — R221 Localized swelling, mass and lump, neck: Secondary | ICD-10-CM | POA: Diagnosis present

## 2017-06-14 LAB — BASIC METABOLIC PANEL
ANION GAP: 10 (ref 5–15)
BUN: 14 mg/dL (ref 6–20)
CALCIUM: 9.7 mg/dL (ref 8.9–10.3)
CO2: 23 mmol/L (ref 22–32)
Chloride: 105 mmol/L (ref 101–111)
Creatinine, Ser: 0.88 mg/dL (ref 0.61–1.24)
GFR calc Af Amer: >60 mL/min (ref >60)
GLUCOSE: 87 mg/dL (ref 65–99)
Potassium: 4 mmol/L (ref 3.5–5.1)
Sodium: 138 mmol/L (ref 135–145)

## 2017-06-14 LAB — CBC WITH DIFFERENTIAL/PLATELET
BASOS ABS: 0 10*3/uL (ref 0.0–0.1)
BASOS PCT: 0 %
EOS ABS: 0.1 10*3/uL (ref 0.0–0.7)
EOS PCT: 1 %
HCT: 39.5 % (ref 39.0–52.0)
Hemoglobin: 14 g/dL (ref 13.0–17.0)
Lymphocytes Relative: 13 %
Lymphs Abs: 2 10*3/uL (ref 0.7–4.0)
MCH: 32.1 pg (ref 26.0–34.0)
MCHC: 35.4 g/dL (ref 30.0–36.0)
MCV: 90.6 fL (ref 78.0–100.0)
MONO ABS: 1.3 10*3/uL — AB (ref 0.1–1.0)
Monocytes Relative: 9 %
Neutro Abs: 11.8 10*3/uL — ABNORMAL HIGH (ref 1.7–7.7)
Neutrophils Relative %: 77 %
PLATELETS: 214 10*3/uL (ref 150–400)
RBC: 4.36 MIL/uL (ref 4.22–5.81)
RDW: 11.7 % (ref 11.5–15.5)
WBC: 15.2 10*3/uL — ABNORMAL HIGH (ref 4.0–10.5)

## 2017-06-14 LAB — RAPID STREP SCREEN (MED CTR MEBANE ONLY): Streptococcus, Group A Screen (Direct): NEGATIVE

## 2017-06-14 MED ORDER — CLINDAMYCIN HCL 150 MG PO CAPS
600.0000 mg | ORAL_CAPSULE | Freq: Once | ORAL | Status: AC
  Administered 2017-06-14: 600 mg via ORAL
  Filled 2017-06-14: qty 4

## 2017-06-14 MED ORDER — IOPAMIDOL (ISOVUE-300) INJECTION 61%
INTRAVENOUS | Status: AC
  Administered 2017-06-14: 75 mL
  Filled 2017-06-14: qty 75

## 2017-06-14 MED ORDER — CLINDAMYCIN HCL 300 MG PO CAPS: 600.0000 mg | ORAL_CAPSULE | Freq: Three times a day (TID) | ORAL | 0 refills | Status: AC

## 2017-06-14 MED ORDER — LIDOCAINE-EPINEPHRINE (PF) 2 %-1:200000 IJ SOLN
30.0000 mL | Freq: Once | INTRAMUSCULAR | Status: AC
  Administered 2017-06-14: 30 mL
  Filled 2017-06-14: qty 40

## 2017-06-14 MED ORDER — SODIUM CHLORIDE 0.9 % IV BOLUS (SEPSIS)
500.0000 mL | Freq: Once | INTRAVENOUS | Status: AC
  Administered 2017-06-14: 500 mL via INTRAVENOUS

## 2017-06-14 NOTE — ED Provider Notes (Signed)
Emergency Department Provider Note   I have reviewed the triage vital signs and the nursing notes.   HISTORY  Chief Complaint Oral Swelling   HPI Maurice Roth is a 26 y.o. male with no significant PMH presents to the emergency department for evaluation of worsening throat swelling has been persistent throughout the day. Patient states that 6 days ago he developed fever and sore throat. That seemed to resolve after 2 days but intermittently has felt swelling in the back of his throat. Today the symptoms seem to worsen significantly. No fevers today. Denies any voice changes. He describes pain on the left side of his posterior pharynx but also is experiencing discomfort into the left lateral neck. He is managing oral secretions. Patient has not started any new medications. Denies any itching or rash. No significant difficulty breathing.  Past Medical History:  Diagnosis Date  . 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 ADD 02/17/2009  . GERD 06/22/2009    Patient Active Problem List   Diagnosis Date Noted  . GERD 06/22/2009  . ADD 02/17/2009  . ACNE VULGARIS 02/17/2009    Past Surgical History:  Procedure Laterality Date  . FOREIGN BODY REMOVAL N/A 07/13/2015   Procedure: EXPLORATION AND REMOVAL OF SOFT TISSUE FOREIGN BODY ;  Surgeon: Jerrell Belfast, MD;  Location: [place];  Service: ENT;  Laterality: N/A;  . WISDOM TOOTH EXTRACTION      Current Outpatient Rx  . Order #: [id number] Class: Print  . Order #: [id number] Class: Normal    Allergies Patient has no known allergies.  Family History  Problem Relation Age of Onset  . Hypertension Father   . Arthritis Neg Hx        family hx  . Hyperlipidemia Neg Hx        family hx    Social History Social History  Substance Use Topics  . Smoking status: Current Some Day Smoker  . Smokeless tobacco: Never Used  . Alcohol use 0.0 oz/week     Comment: occ    Review of Systems  Constitutional: No fever/chills Eyes: No visual  changes. ENT: Positive sensation of throat swelling.  Cardiovascular: Denies chest pain. Respiratory: Denies shortness of breath. Gastrointestinal: No abdominal pain.  No nausea, no vomiting.  No diarrhea.  No constipation. Genitourinary: Negative for dysuria. Musculoskeletal: Negative for back pain. Skin: Negative for rash. Neurological: Negative for headaches, focal weakness or numbness.  10-point ROS otherwise negative.  ____________________________________________   PHYSICAL EXAM:  VITAL SIGNS: ED Triage Vitals [06/14/17 1425]  Enc Vitals Group     BP 139/66     Pulse Rate 66     Resp 18     Temp 99.8 F (37.7 C)     Temp Source Oral     SpO2 100 %   Constitutional: Alert and oriented. Well appearing and in no acute distress. Eyes: Conjunctivae are normal.  Head: Atraumatic. Nose: No congestion/rhinnorhea. Mouth/Throat: Mucous membranes are moist. Tonsillar hypertrophy on the patient's left. No PTA. Managing oral secretions and speaking in clear voice. Mild trismus.  Neck: No stridor. Cardiovascular: Normal rate, regular rhythm. Good peripheral circulation. Grossly normal heart sounds.   Respiratory: Normal respiratory effort.  No retractions. Lungs CTAB. Gastrointestinal: Soft and nontender. No distention.  Musculoskeletal: No lower extremity tenderness nor edema. No gross deformities of extremities. Neurologic:  Normal speech and language. No gross focal neurologic deficits are appreciated.  Skin:  Skin is warm, dry and intact. No rash noted.  ____________________________________________   LABS (all  labs ordered are listed, but only abnormal results are displayed)  Labs Reviewed  CBC WITH DIFFERENTIAL/PLATELET - Abnormal; Notable for the following:       Result Value   WBC 15.2 (*)    Neutro Abs 11.8 (*)    Monocytes Absolute 1.3 (*)    All other components within normal limits  RAPID STREP SCREEN (NOT AT [facility])  CULTURE, GROUP A STREP [facility])  BASIC  METABOLIC PANEL   ____________________________________________  RADIOLOGY  Ct Soft Tissue Neck W Contrast  Result Date: 06/14/2017 CLINICAL DATA:  Dysphagia for 7 days. Feels as if there is a lump in throat. EXAM: CT NECK WITH CONTRAST TECHNIQUE: Multidetector CT imaging of the neck was performed using the standard protocol following the bolus administration of intravenous contrast. CONTRAST:  48mL ISOVUE-300 IOPAMIDOL (ISOVUE-300) INJECTION 61% COMPARISON:  None. FINDINGS: Pharynx and larynx: There is mass effect on the oropharynx from LEFT-to-RIGHT due to a moderate-sized LEFT peritonsillar abscess with surrounding inflammation. A well-defined margin of enhancing tissue surrounds the abscess, which has cross-sectional measurements of 15 x 15 x 18 mm. Some edema/effusion extends into the retropharyngeal space, but there is no primary retropharyngeal process. Moderate edema extends into the uvula and there is effacement of the nasopharyngeal mucosal surfaces. Slight effacement of the parapharyngeal fat. Normal larynx. Salivary glands: No inflammation, mass, or stone. Thyroid: Normal. Lymph nodes: Reactive LEFT-sided lymphadenopathy. Vascular: Patent. Limited intracranial: Negative. Visualized orbits: Negative. Mastoids and visualized paranasal sinuses: Negative. Skeleton: No acute or aggressive process. Upper chest: Negative. Other: None. IMPRESSION: LEFT peritonsillar abscess, 15 x 15 x 18 mm. Regional edema, with uvular swelling, and retropharyngeal edema/effusion, but no primary retropharyngeal process. Reactive LEFT lymphadenopathy. These results were called by telephone at the time of interpretation on 06/14/2017 at 4:41 pm to Dr. Kathrynn Humble, who verbally acknowledged these results. Electronically Signed   By: Staci Righter M.D.   On: 06/14/2017 16:42    ____________________________________________   PROCEDURES  Procedure(s) performed:    Procedures  None ____________________________________________   INITIAL IMPRESSION / ASSESSMENT AND PLAN / ED COURSE  Pertinent labs & imaging results that were available during my care of the patient were reviewed by me and considered in my medical decision making (see chart for details).  Patient resents to the emergency department for evaluation of sensation of throat swelling worsening throughout the day. Symptoms began 6 days ago with 2 days of fever and sore throat. Sensation of throat swelling seems to be waxing and waning but worse today. No evidence to suggest impending airway emergency or angioedema. The patient is speaking in a clear voice and does seem slightly anxious. Vital signs are normal. Plan for rapid strep, labs, CT angiogram of the neck given his relatively unremarkable exam and complaint of pain tracking into the left lateral neck. Need to rule out deep space infection or abscess.   Care transferred to Dr. Kathrynn Humble pending CT.  ____________________________________________  FINAL CLINICAL IMPRESSION(S) / ED DIAGNOSES  Final diagnoses:  Peritonsillar cellulitis     MEDICATIONS GIVEN DURING THIS VISIT:  Medications  sodium chloride 0.9 % bolus 500 mL (0 mLs Intravenous Stopped 06/14/17 1700)  iopamidol (ISOVUE-300) 61 % injection (75 mLs  Contrast Given 06/14/17 1604)  lidocaine-EPINEPHrine (XYLOCAINE W/EPI) 2 %-1:200000 (PF) injection 30 mL (30 mLs Infiltration Given 06/14/17 1818)  clindamycin (CLEOCIN) capsule 600 mg (600 mg Oral Given 06/14/17 1818)     NEW OUTPATIENT MEDICATIONS STARTED DURING THIS VISIT:  Discharge Medication List as of 06/14/2017  5:54  PM    START taking these medications   Details  clindamycin (CLEOCIN) 300 MG capsule Take 2 capsules (600 mg total) by mouth 3 (three) times daily., Starting Fri 06/14/2017, Print          Note:  This document was prepared using Dragon voice recognition software and may include unintentional  dictation errors.  Nanda Quinton, MD Emergency Medicine    Annaly Skop, [facility], MD 06/14/17 [phone]

## 2017-06-14 NOTE — ED Notes (Signed)
Pt verbalized understanding discharge instructions and denies any further needs or questions at this time. VS stable, ambulatory and steady gait.   

## 2017-06-14 NOTE — Consult Note (Signed)
Reason for Consult:sore throat Referring Physician: [name], MD  Maurice Roth is an 26 y.o. male.  HPI: 1 week history of sore throat, localized to the left side. He has not sought out medical care until today. He has been taking ibuprofen. He normally does not have difficulties with his throat or tonsils. He is a light smoker.  Past Medical History:  Diagnosis Date  . 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 ADD 02/17/2009  . GERD 06/22/2009    Past Surgical History:  Procedure Laterality Date  . FOREIGN BODY REMOVAL N/A 07/13/2015   Procedure: EXPLORATION AND REMOVAL OF SOFT TISSUE FOREIGN BODY ;  Surgeon: Jerrell Belfast, MD;  Location: [facility] OR;  Service: ENT;  Laterality: N/A;  . WISDOM TOOTH EXTRACTION      Family History  Problem Relation Age of Onset  . Hypertension Father   . Arthritis Neg Hx        family hx  . Hyperlipidemia Neg Hx        family hx    Social History:  reports that he has been smoking.  He has never used smokeless tobacco. He reports that he drinks alcohol. He reports that he does not use drugs.  Allergies: No Known Allergies  Medications: Reviewed  Results for orders placed or performed during the hospital encounter of 06/14/17 (from the past 48 hour(s))  Rapid strep screen     Status: None   Collection Time: 06/14/17  2:37 PM  Result Value Ref Range   Streptococcus, Group A Screen (Direct) NEGATIVE NEGATIVE    Comment: (NOTE) A Rapid Antigen test may result negative if the antigen level in the sample is below the detection level of this test. The FDA has not cleared this test as a stand-alone test therefore the rapid antigen negative result has reflexed to a Group A Strep culture.   Basic metabolic panel     Status: None   Collection Time: 06/14/17  2:40 PM  Result Value Ref Range   Sodium 138 135 - 145 mmol/L   Potassium 4.0 3.5 - 5.1 mmol/L   Chloride 105 101 - 111 mmol/L   CO2 23 22 - 32 mmol/L   Glucose, Bld 87 65 - 99 mg/dL   BUN 14 6 -  20 mg/dL   Creatinine, Ser 0.88 0.61 - 1.24 mg/dL   Calcium 9.7 8.9 - 10.3 mg/dL   GFR calc non Af Amer >60 >60 mL/min   GFR calc Af Amer >60 >60 mL/min    Comment: (NOTE) The eGFR has been calculated using the CKD EPI equation. This calculation has not been validated in all clinical situations. eGFR's persistently <60 mL/min signify possible Chronic Kidney Disease.    Anion gap 10 5 - 15  CBC with Differential     Status: Abnormal   Collection Time: 06/14/17  2:40 PM  Result Value Ref Range   WBC 15.2 (H) 4.0 - 10.5 K/uL   RBC 4.36 4.22 - 5.81 MIL/uL   Hemoglobin 14.0 13.0 - 17.0 g/dL   HCT 39.5 39.0 - 52.0 %   MCV 90.6 78.0 - 100.0 fL   MCH 32.1 26.0 - 34.0 pg   MCHC 35.4 30.0 - 36.0 g/dL   RDW 11.7 11.5 - 15.5 %   Platelets 214 150 - 400 K/uL   Neutrophils Relative % 77 %   Neutro Abs 11.8 (H) 1.7 - 7.7 K/uL   Lymphocytes Relative 13 %   Lymphs Abs 2.0 0.7 - 4.0 K/uL  Monocytes Relative 9 %   Monocytes Absolute 1.3 (H) 0.1 - 1.0 K/uL   Eosinophils Relative 1 %   Eosinophils Absolute 0.1 0.0 - 0.7 K/uL   Basophils Relative 0 %   Basophils Absolute 0.0 0.0 - 0.1 K/uL    Ct Soft Tissue Neck W Contrast  Result Date: 06/14/2017 CLINICAL DATA:  Dysphagia for 7 days. Feels as if there is a lump in throat. EXAM: CT NECK WITH CONTRAST TECHNIQUE: Multidetector CT imaging of the neck was performed using the standard protocol following the bolus administration of intravenous contrast. CONTRAST:  52m ISOVUE-300 IOPAMIDOL (ISOVUE-300) INJECTION 61% COMPARISON:  None. FINDINGS: Pharynx and larynx: There is mass effect on the oropharynx from LEFT-to-RIGHT due to a moderate-sized LEFT peritonsillar abscess with surrounding inflammation. A well-defined margin of enhancing tissue surrounds the abscess, which has cross-sectional measurements of 15 x 15 x 18 mm. Some edema/effusion extends into the retropharyngeal space, but there is no primary retropharyngeal process. Moderate edema extends  into the uvula and there is effacement of the nasopharyngeal mucosal surfaces. Slight effacement of the parapharyngeal fat. Normal larynx. Salivary glands: No inflammation, mass, or stone. Thyroid: Normal. Lymph nodes: Reactive LEFT-sided lymphadenopathy. Vascular: Patent. Limited intracranial: Negative. Visualized orbits: Negative. Mastoids and visualized paranasal sinuses: Negative. Skeleton: No acute or aggressive process. Upper chest: Negative. Other: None. IMPRESSION: LEFT peritonsillar abscess, 15 x 15 x 18 mm. Regional edema, with uvular swelling, and retropharyngeal edema/effusion, but no primary retropharyngeal process. Reactive LEFT lymphadenopathy. These results were called by telephone at the time of interpretation on 06/14/2017 at 4:41 pm to Dr. NKathrynn Humble who verbally acknowledged these results. Electronically Signed   By: JStaci RighterM.D.   On: 06/14/2017 16:42    RFWY:[id number] as listed in admit H&P  Blood pressure (!) 135/44, pulse 82, temperature 99.4 F (37.4 C), temperature source Oral, resp. rate 15, SpO2 100 %.  PHYSICAL EXAM: Overall appearance:  Healthy appearing, in no distress. His breathing is clear and his voice is normal. Head:  Normocephalic, atraumatic. Ears: External ears look healthy. Nose: External nose is healthy in appearance. Internal nasal exam free of any lesions or obstruction. Oral Cavity/Pharynx:  There is mild trismus. There is asymmetric swelling of the left tonsil, with erythema of the mucosa and fullness of the soft palate. Larynx/Hypopharynx: Deferred Neuro:  No identifiable neurologic deficits. Neck: No palpable neck masses.  Studies Reviewed: CT neck reviewed. Possible abscess left peritonsillar space.  Procedures: attempted incision and drainage left peritonsillar abscess.  2% Xylocaine with epinephrine was infiltrated into the left side soft palate mucosa. #18-gauge needle was used to attempt aspiration in the peritonsillar space and  multiple places. Purulence was not obtained. 6 attempts were made but there is no pus obtained.   Assessment/Plan: Peritonsillar phlegmon. Recommend we start him on clindamycin 300 mg 3 times a day for 10 days. He is instructed to use Tylenol/Motrin as needed for pain. He will follow-up with me in the office on Tuesday if he is doing okay or sooner over the weekend if he gets any worse.  Maurice Roth 06/14/2017, 6:08 PM

## 2017-06-14 NOTE — ED Provider Notes (Signed)
  Physical Exam  BP 129/66   Pulse 67   Temp 99.4 F (37.4 C) (Oral)   Resp 18   SpO2 100%   Physical Exam  ED Course  Procedures  MDM  Assuming care of patient from Dr. Laverta Baltimore   Patient in the ED for sore throat and neck pain. Pt feels that his throat appears a bit closed. Workup thus far shows is neg except for elevated WC  Concerning findings are as following none Important pending results are CT soft tissue neck  According to Dr. Laverta Baltimore, plan is to d/c if CT soft tissue is neg. He doesn't think pt has angioedema. If CT is neg, d/c with steroids and antiinflammatory.  Patient had no complains, no concerns from the nursing side. Will continue to monitor.        [name], MD 06/14/17 [id number]

## 2017-06-14 NOTE — ED Triage Notes (Signed)
Patient from home complaining of throat swelling, painful swallowing x7 days.  Oxygen saturation 100% on room air, no dyspnea noted.  Patient alert and oriented and in no apparent distress at this time.

## 2017-06-14 NOTE — ED Notes (Signed)
Patient transported to CT 

## 2017-06-14 NOTE — Discharge Instructions (Signed)
No abscess appreciated. If your symptoms get worse - you start drooling, have difficulty breathing - return to th ER immediately.

## 2017-06-16 LAB — CULTURE, GROUP A STREP (THRC)

## 2018-06-04 IMAGING — CT CT NECK W/ CM
5 of 6 series · 14 of 33 positions shown, 16 images · IV contrast (APPLIED)
Comparison: None.

CLINICAL DATA: Dysphagia for 7 days. Feels as if there is a lump in
throat.

EXAM:
CT NECK WITH CONTRAST
TECHNIQUE: Multidetector CT imaging of the neck was performed using the
standard protocol following the bolus administration of intravenous
contrast.
CONTRAST:  75mL 9H3ZC2-WFF IOPAMIDOL (9H3ZC2-WFF) INJECTION 61%

[Series 3: axial neck · axial · 0.50mm/px · z∈[-184,-96]mm · 2 of 133 slices shown, 3 images]
[im 45/133  soft-tissue]
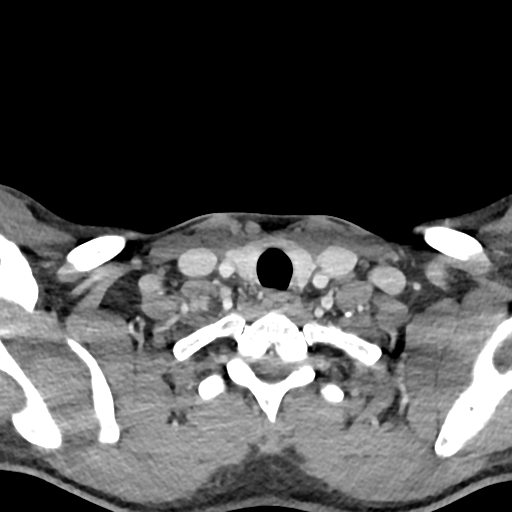
[im 45/133  bone]
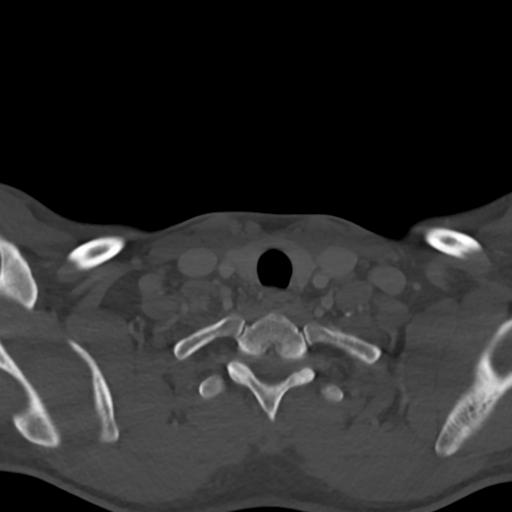
[im 89/133  bone]
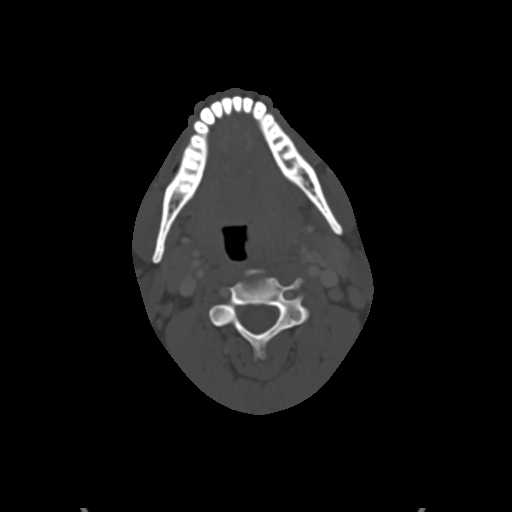

[Series 5: axial bone · axial · 0.50mm/px · z∈[-184,-96]mm · 2 of 133 slices shown]
[im 45/133  bone]
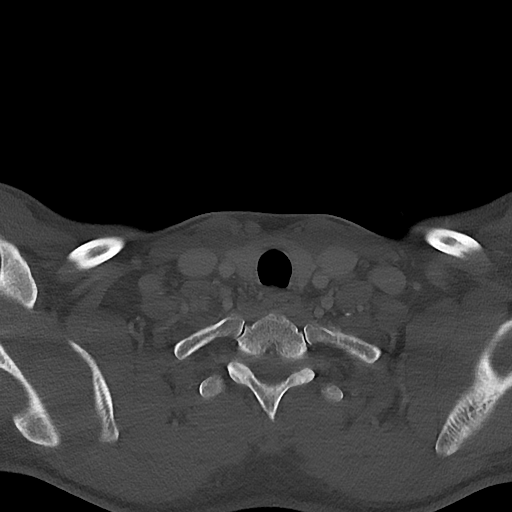
[im 89/133  bone]
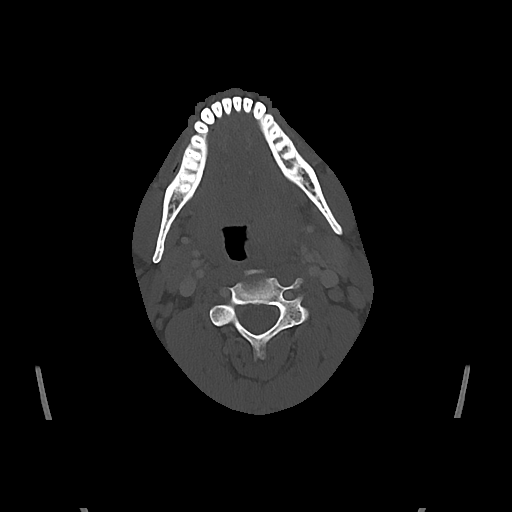

[Series 6: sag neck · sagittal · 0.52mm/px · 5 of 101 slices shown, 6 images]
[im 34/101  bone]
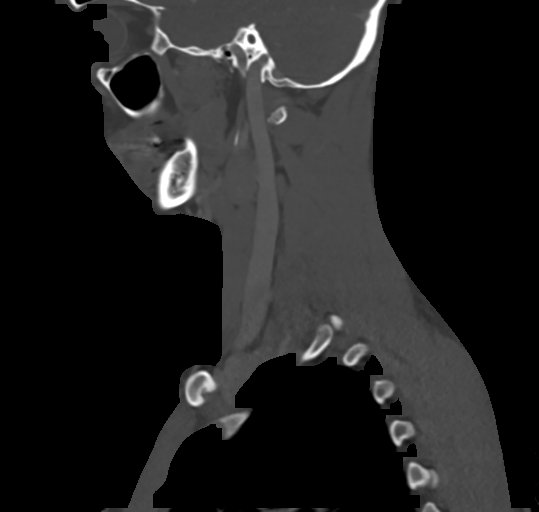
[im 42/101  bone]
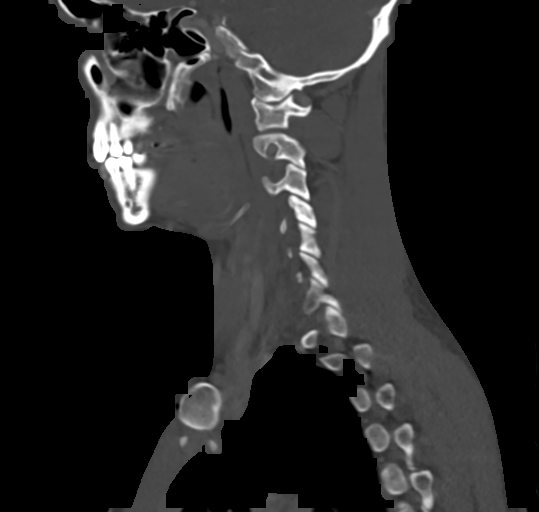
[im 51/101  soft-tissue]
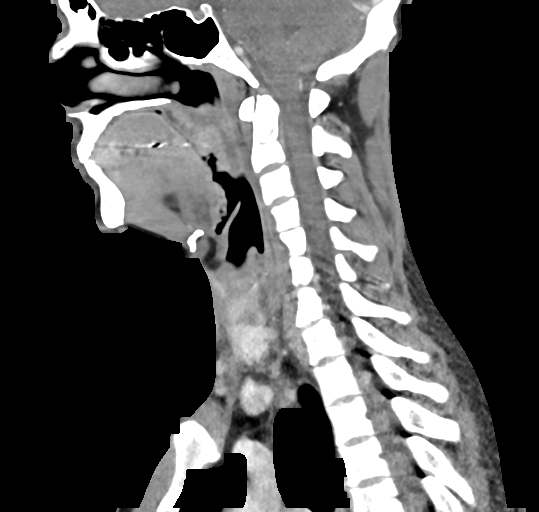
[im 51/101  bone]
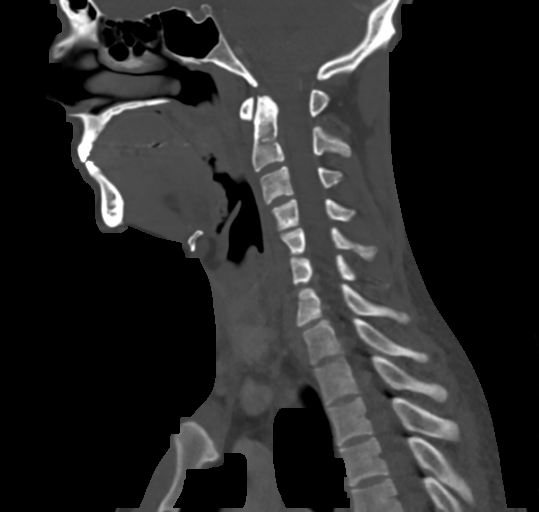
[im 59/101  bone]
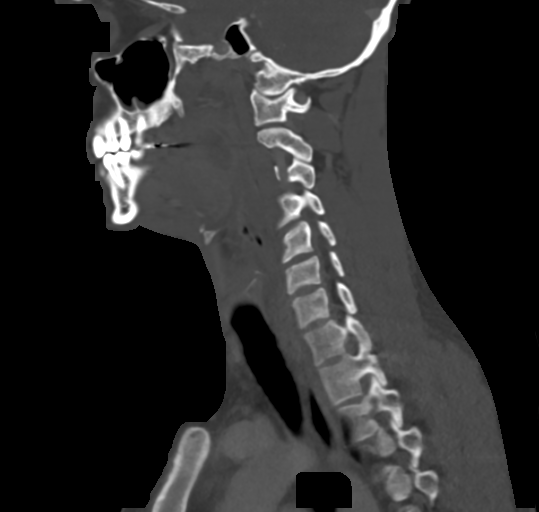
[im 67/101  bone]
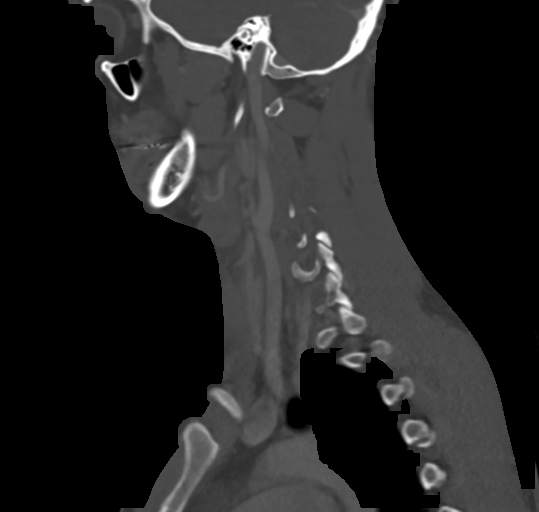

[Series 7: cor neck · coronal · 0.52mm/px · 3 of 121 slices shown]
[im 25/121  bone]
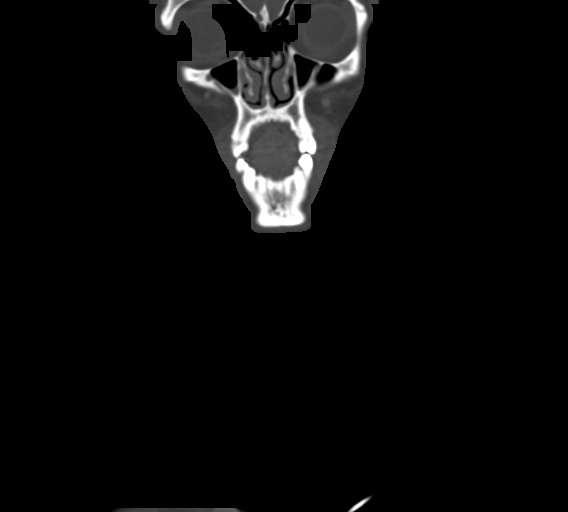
[im 49/121  bone]
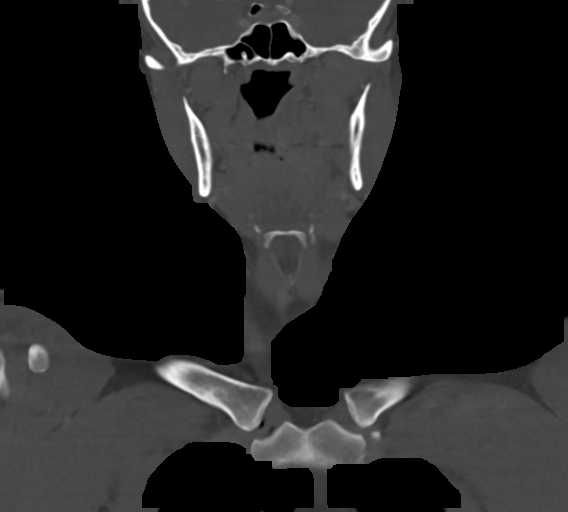
[im 73/121  bone]
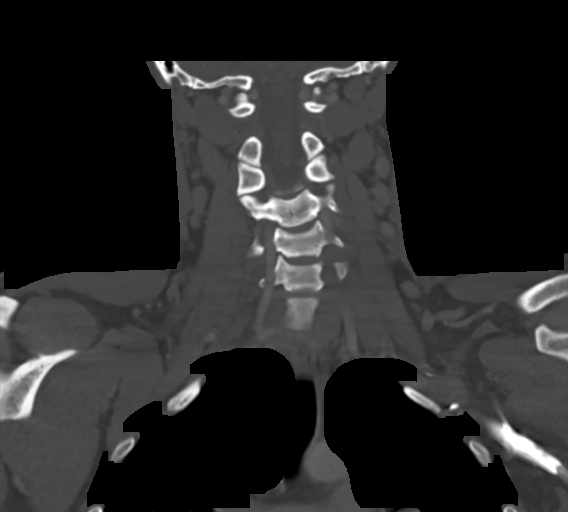

[Series 8: ax oropharynx · axial · 0.39mm/px · z∈[-184,-96]mm · 2 of 134 slices shown]
[im 45/134  bone]
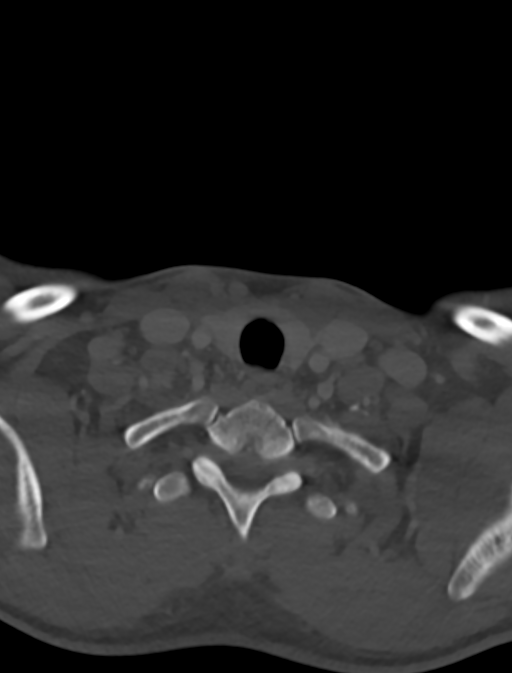
[im 89/134  bone]
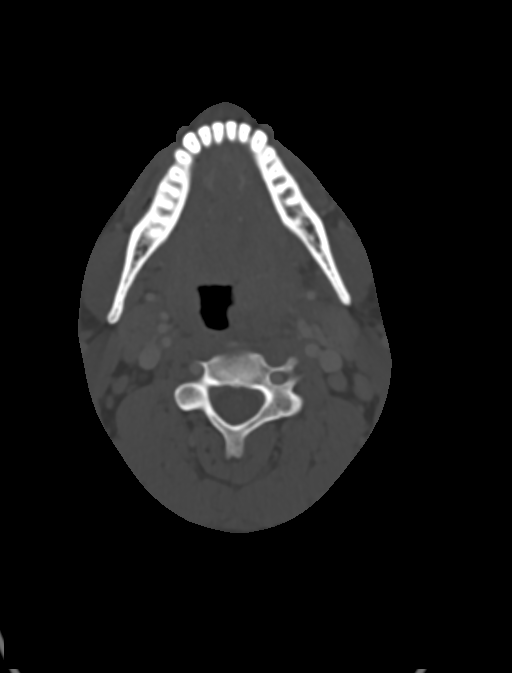

[14 of 33 positions shown; findings below may reference images not displayed]

FINDINGS: Pharynx and larynx: There is mass effect on the oropharynx from
LEFT-to-RIGHT due to a moderate-sized LEFT peritonsillar abscess
with surrounding inflammation. A well-defined margin of enhancing
tissue surrounds the abscess, which has cross-sectional measurements
of 15 x 15 x 18 mm. Some edema/effusion extends into the
retropharyngeal space, but there is no primary retropharyngeal
process. Moderate edema extends into the uvula and there is
effacement of the nasopharyngeal mucosal surfaces. Slight effacement
of the parapharyngeal fat. Normal larynx.

Salivary glands: No inflammation, mass, or stone.

Thyroid: Normal.

Lymph nodes: Reactive LEFT-sided lymphadenopathy.

Vascular: Patent.

Limited intracranial: Negative.

Visualized orbits: Negative.

Mastoids and visualized paranasal sinuses: Negative.

Skeleton: No acute or aggressive process.

Upper chest: Negative.

Other: None.
IMPRESSION: LEFT peritonsillar abscess, 15 x 15 x 18 mm.

Regional edema, with uvular swelling, and retropharyngeal
edema/effusion, but no primary retropharyngeal process. Reactive
LEFT lymphadenopathy.

These results were called by telephone at the time of interpretation
on 06/14/2017 at [DATE] to Dr. Kagambega, who verbally acknowledged
these results.
